# Patient Record
Sex: Female | Born: 1980
Health system: Southern US, Community
[De-identification: ages and names within clinical notes are randomized; demographics above are authoritative.]

## PROBLEM LIST (undated history)

## (undated) DIAGNOSIS — Z9889 Other specified postprocedural states: Secondary | ICD-10-CM

## (undated) DIAGNOSIS — F419 Anxiety disorder, unspecified: Secondary | ICD-10-CM

## (undated) DIAGNOSIS — R42 Dizziness and giddiness: Secondary | ICD-10-CM

## (undated) DIAGNOSIS — Z8619 Personal history of other infectious and parasitic diseases: Secondary | ICD-10-CM

## (undated) DIAGNOSIS — T8859XA Other complications of anesthesia, initial encounter: Secondary | ICD-10-CM

## (undated) DIAGNOSIS — C801 Malignant (primary) neoplasm, unspecified: Secondary | ICD-10-CM

## (undated) DIAGNOSIS — R51 Headache: Secondary | ICD-10-CM

## (undated) DIAGNOSIS — D649 Anemia, unspecified: Secondary | ICD-10-CM

## (undated) DIAGNOSIS — O139 Gestational [pregnancy-induced] hypertension without significant proteinuria, unspecified trimester: Secondary | ICD-10-CM

## (undated) DIAGNOSIS — K649 Unspecified hemorrhoids: Secondary | ICD-10-CM

## (undated) DIAGNOSIS — F429 Obsessive-compulsive disorder, unspecified: Secondary | ICD-10-CM

## (undated) DIAGNOSIS — R112 Nausea with vomiting, unspecified: Secondary | ICD-10-CM

## (undated) DIAGNOSIS — T4145XA Adverse effect of unspecified anesthetic, initial encounter: Secondary | ICD-10-CM

## (undated) HISTORY — PX: WISDOM TOOTH EXTRACTION: SHX21

## (undated) HISTORY — PX: DILATION AND CURETTAGE OF UTERUS: SHX78

## (undated) HISTORY — DX: Headache: R51

## (undated) HISTORY — DX: Gestational (pregnancy-induced) hypertension without significant proteinuria, unspecified trimester: O13.9

## (undated) HISTORY — DX: Obsessive-compulsive disorder, unspecified: F42.9

## (undated) HISTORY — DX: Dizziness and giddiness: R42

## (undated) HISTORY — DX: Anxiety disorder, unspecified: F41.9

## (undated) HISTORY — PX: OTHER SURGICAL HISTORY: SHX169

## (undated) HISTORY — DX: Personal history of other infectious and parasitic diseases: Z86.19

---
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
## 2002-04-20 ENCOUNTER — Encounter: Payer: Self-pay | Admitting: Internal Medicine

## 2002-04-20 ENCOUNTER — Encounter: Admission: RE | Admit: 2002-04-20 | Discharge: 2002-04-20 | Payer: Self-pay | Admitting: Internal Medicine

## 2003-05-08 HISTORY — PX: BREAST SURGERY: SHX581

## 2004-12-12 ENCOUNTER — Ambulatory Visit (HOSPITAL_COMMUNITY): Admission: RE | Admit: 2004-12-12 | Discharge: 2004-12-12 | Payer: Self-pay | Admitting: Chiropractic Medicine

## 2006-02-13 ENCOUNTER — Other Ambulatory Visit: Admission: RE | Admit: 2006-02-13 | Discharge: 2006-02-13 | Payer: Self-pay | Admitting: Obstetrics and Gynecology

## 2007-02-24 ENCOUNTER — Other Ambulatory Visit: Admission: RE | Admit: 2007-02-24 | Discharge: 2007-02-24 | Payer: Self-pay | Admitting: Obstetrics and Gynecology

## 2008-04-07 ENCOUNTER — Other Ambulatory Visit: Admission: RE | Admit: 2008-04-07 | Discharge: 2008-04-07 | Payer: Self-pay | Admitting: Obstetrics and Gynecology

## 2008-09-29 ENCOUNTER — Other Ambulatory Visit: Admission: RE | Admit: 2008-09-29 | Discharge: 2008-09-29 | Payer: Self-pay | Admitting: Obstetrics and Gynecology

## 2009-02-01 ENCOUNTER — Other Ambulatory Visit: Admission: RE | Admit: 2009-02-01 | Discharge: 2009-02-01 | Payer: Self-pay | Admitting: Obstetrics and Gynecology

## 2009-12-06 ENCOUNTER — Other Ambulatory Visit: Admission: RE | Admit: 2009-12-06 | Discharge: 2009-12-06 | Payer: Self-pay | Admitting: Obstetrics and Gynecology

## 2011-11-12 ENCOUNTER — Other Ambulatory Visit: Payer: Self-pay

## 2011-12-10 ENCOUNTER — Other Ambulatory Visit (HOSPITAL_COMMUNITY): Payer: Self-pay | Admitting: Obstetrics & Gynecology

## 2011-12-10 DIAGNOSIS — O3512X0 Maternal care for (suspected) chromosomal abnormality in fetus, trisomy 18, not applicable or unspecified: Secondary | ICD-10-CM

## 2011-12-10 DIAGNOSIS — O3510X0 Maternal care for (suspected) chromosomal abnormality in fetus, unspecified, not applicable or unspecified: Secondary | ICD-10-CM

## 2011-12-10 DIAGNOSIS — IMO0002 Reserved for concepts with insufficient information to code with codable children: Secondary | ICD-10-CM

## 2011-12-10 DIAGNOSIS — O351XX0 Maternal care for (suspected) chromosomal abnormality in fetus, not applicable or unspecified: Secondary | ICD-10-CM

## 2011-12-12 ENCOUNTER — Ambulatory Visit (HOSPITAL_COMMUNITY)
Admission: RE | Admit: 2011-12-12 | Discharge: 2011-12-12 | Disposition: A | Discharge status: Home / Self Care | Payer: BC Managed Care – PPO | Source: Ambulatory Visit | Attending: Obstetrics & Gynecology | Admitting: Obstetrics & Gynecology

## 2011-12-12 ENCOUNTER — Encounter (HOSPITAL_COMMUNITY): Payer: Self-pay

## 2011-12-12 ENCOUNTER — Other Ambulatory Visit (HOSPITAL_COMMUNITY): Payer: Self-pay | Admitting: Obstetrics & Gynecology

## 2011-12-12 ENCOUNTER — Ambulatory Visit (HOSPITAL_COMMUNITY): Admission: RE | Admit: 2011-12-12 | Payer: BC Managed Care – PPO | Source: Ambulatory Visit

## 2011-12-12 ENCOUNTER — Other Ambulatory Visit (HOSPITAL_COMMUNITY): Payer: Self-pay

## 2011-12-12 DIAGNOSIS — IMO0001 Reserved for inherently not codable concepts without codable children: Secondary | ICD-10-CM | POA: Insufficient documentation

## 2011-12-12 DIAGNOSIS — IMO0002 Reserved for concepts with insufficient information to code with codable children: Secondary | ICD-10-CM

## 2011-12-12 DIAGNOSIS — O3510X0 Maternal care for (suspected) chromosomal abnormality in fetus, unspecified, not applicable or unspecified: Secondary | ICD-10-CM

## 2011-12-12 DIAGNOSIS — O351XX0 Maternal care for (suspected) chromosomal abnormality in fetus, not applicable or unspecified: Secondary | ICD-10-CM

## 2011-12-12 DIAGNOSIS — O289 Unspecified abnormal findings on antenatal screening of mother: Secondary | ICD-10-CM | POA: Insufficient documentation

## 2011-12-12 DIAGNOSIS — O3512X0 Maternal care for (suspected) chromosomal abnormality in fetus, trisomy 18, not applicable or unspecified: Secondary | ICD-10-CM

## 2011-12-12 NOTE — Progress Notes (Signed)
Genetic Counseling  High-Risk Gestation Note  Appointment Date:  12/12/2011 Referred By: Robley Fries, MD Date of Birth:  [birth year] Partner:  Maria Berg   Pregnancy History: G1P0 Estimated Date of Delivery: 06/13/12 Estimated Gestational Age: [redacted]w[redacted]d Attending: Alpha Gula, MD  Maria Berg and her husband, Maria Berg, were seen for genetic counseling because of an increased risk for fetal trisomy 18/13 based on the biochemistry portion of First trimester screening.  A nuchal translucency was not obtained and thus not incorporated into the risk calculation.  They were counseled regarding the First trimester biochemistry result and the associated 1 in <5 risk for fetal trisomy 18/13.  We reviewed chromosomes, nondisjunction, and the common features and poor prognosis of trisomy 18/13.  In addition, we reviewed the screen adjusted reduction in risks for Down syndrome (1 in 609 to 1 in 4344).  We also discussed other explanations for a screen positive result including: a gestational dating error, differences in maternal metabolism, and normal variation. They understand that this screening is not diagnostic for aneuploidy but provides a pregnancy specific risk assessment for Down syndrome, trisomy 18/13.  We reviewed other available screening options including noninvasive prenatal testing (NIPT) and detailed ultrasound.  Specifically, we discussed that NIPT analyzes cell free fetal DNA found in the maternal circulation. This test is not diagnostic for chromosome conditions, but can provide information regarding the presence or absence of extra fetal DNA for chromosomes 13, 18, 21, X, and Y, and missing fetal DNA for chromosome X and Y (Turner syndrome). Thus, it would not identify or rule out all genetic conditions. The reported detection rate is greater than 99% for Trisomy 21, greater than 98% for Trisomy 18, and is approximately 80% (8 out of 10) for Trisomy 13. The false positive  rate is reported to be less than 0.1% for any of these conditions.  Maria Berg reported that she had this testing performed in her obstetrician's office.  The results are pending.    In addition, we discussed that ~90-95% of fetuses with trisomy 18/13, when well visualized, have detectable anomalies or soft markers by detailed ultrasound (~18+ weeks gestation).  Ultrasound was performed today and revealed size less than dates and a single umbilical artery.  The fetal anatomy was not well visualized given the early gestational age.  We discussed that the finding of early growth restriction and SUA are concerning for trisomy 18.  Given the combination of findings, this couple was counseled that the risk for aneuploidy is increased above the screen adjusted 1 in <5.  This considered, we reviewed the availability of diagnostic testing via CVS and amniocentesis.  We reviewed the risks, benefits, and limitations of each, including the risk(s) for spontaneous pregnancy loss. After consideration of all the options, they elected to attempt [facility]S.  The patient was sent to [facility] in [place].  Due to the position of the placenta, the procedure was not performed.  The patient wishes to pursue diagnostic testing as soon as possible.  An amniocentesis has been tentatively scheduled with Dr. Otho Perl on December 21, 2011.  Management recommendations will be made pending discussion of the FISH results.  We reviewed the options of continuing and termination of pregnancy.  This couple wish to defer decision making until they have definitive results.  They were counseled regarding the 20 week limit for termination in [place].  Given the nature of today's visit, this couple declined a formal review of the family history  until a later date.  We will review this history in detail at a future appointment.  Without further information regarding the provided family history, an accurate genetic risk cannot be  calculated. Further genetic counseling is warranted if more information is obtained.  Maria Berg denied exposure to environmental toxins or chemical agents. She denied the use of alcohol, tobacco or street drugs. She denied significant viral illnesses during the course of her pregnancy.   I counseled this couple for approximately 80 minutes regarding the above risks and available options.     Donald Prose, MS  Certified Genetic Counselor

## 2011-12-12 NOTE — Progress Notes (Signed)
Patient seen today  for ultrasound.  See full report in [facility].  Maria Therien, MD 

## 2011-12-25 ENCOUNTER — Other Ambulatory Visit (HOSPITAL_COMMUNITY): Payer: Self-pay

## 2011-12-25 ENCOUNTER — Encounter (HOSPITAL_COMMUNITY): Payer: Self-pay

## 2012-02-26 ENCOUNTER — Other Ambulatory Visit (HOSPITAL_COMMUNITY): Payer: Self-pay | Admitting: Obstetrics & Gynecology

## 2012-02-26 DIAGNOSIS — Z3682 Encounter for antenatal screening for nuchal translucency: Secondary | ICD-10-CM

## 2012-03-11 ENCOUNTER — Encounter: Payer: Self-pay | Admitting: Obstetrics & Gynecology

## 2012-03-11 LAB — US OB TRANSVAGINAL

## 2012-03-19 ENCOUNTER — Other Ambulatory Visit: Payer: Self-pay

## 2012-03-19 ENCOUNTER — Encounter (HOSPITAL_COMMUNITY): Payer: Self-pay

## 2012-03-19 ENCOUNTER — Ambulatory Visit (HOSPITAL_COMMUNITY)
Admission: RE | Admit: 2012-03-19 | Discharge: 2012-03-19 | Disposition: A | Discharge status: Home / Self Care | Payer: BC Managed Care – PPO | Source: Ambulatory Visit | Attending: Obstetrics & Gynecology | Admitting: Obstetrics & Gynecology

## 2012-03-19 DIAGNOSIS — IMO0001 Reserved for inherently not codable concepts without codable children: Secondary | ICD-10-CM | POA: Insufficient documentation

## 2012-03-19 DIAGNOSIS — O289 Unspecified abnormal findings on antenatal screening of mother: Secondary | ICD-10-CM | POA: Insufficient documentation

## 2012-03-28 ENCOUNTER — Telehealth (HOSPITAL_COMMUNITY): Payer: Self-pay

## 2012-03-28 NOTE — Telephone Encounter (Signed)
Called Maria Berg to discuss her [facility], cell free fetal DNA testing. Patient was identified by name and date of birth.  Testing was offered because of a prior pregnancy with aneuploidy.  We reviewed that these are within normal limits, showing a less than 1 in 10,000 risk for trisomies 21, 18 and 13. We reviewed that this testing identifies > 99% of pregnancies with trisomy 21, >98% of pregnancies with trisomy 24, and >80% with trisomy 57; the false positive rate is <0.1% for all conditions. Testing was also performed for X and Y chromosome analysis. This did not show evidence of aneuploidy for X or Y. It was also consistent with female gender. X and Y analysis has a detection rate of approximately 99%. She understands that this testing does not identify all genetic conditions. All questions were answered to her satisfaction, she was encouraged to call with additional questions or concerns.  Her NT ultrasound is scheduled for next week, 04/02/12.  Patient understands that I will not be in the office that day but was encouraged to contact me with any questions.  Despina Arias, MS Certified Genetic Counselor

## 2012-04-02 ENCOUNTER — Other Ambulatory Visit (HOSPITAL_COMMUNITY): Payer: BC Managed Care – PPO

## 2012-04-02 ENCOUNTER — Ambulatory Visit (HOSPITAL_COMMUNITY)
Admission: RE | Admit: 2012-04-02 | Discharge: 2012-04-02 | Disposition: A | Discharge status: Home / Self Care | Payer: BC Managed Care – PPO | Source: Ambulatory Visit | Attending: Obstetrics & Gynecology | Admitting: Obstetrics & Gynecology

## 2012-04-02 ENCOUNTER — Encounter (HOSPITAL_COMMUNITY): Payer: Self-pay

## 2012-04-02 VITALS — BP 140/90 | HR 106 | Wt 151.0 lb

## 2012-04-02 DIAGNOSIS — Z3682 Encounter for antenatal screening for nuchal translucency: Secondary | ICD-10-CM

## 2012-04-02 DIAGNOSIS — O351XX0 Maternal care for (suspected) chromosomal abnormality in fetus, not applicable or unspecified: Secondary | ICD-10-CM | POA: Insufficient documentation

## 2012-04-02 DIAGNOSIS — O3510X0 Maternal care for (suspected) chromosomal abnormality in fetus, unspecified, not applicable or unspecified: Secondary | ICD-10-CM | POA: Insufficient documentation

## 2012-04-02 DIAGNOSIS — Z3689 Encounter for other specified antenatal screening: Secondary | ICD-10-CM | POA: Insufficient documentation

## 2012-04-02 DIAGNOSIS — Z1389 Encounter for screening for other disorder: Secondary | ICD-10-CM

## 2012-04-02 DIAGNOSIS — O352XX0 Maternal care for (suspected) hereditary disease in fetus, not applicable or unspecified: Secondary | ICD-10-CM | POA: Insufficient documentation

## 2012-04-02 NOTE — Progress Notes (Signed)
Maria Berg  was seen today for an ultrasound appointment.  See full report in [facility].  Comments: Ms. Lakins was seen today for NT evaluation.  Her recent pregnancy was affected by Triploidy.  Recent NIPT showed low risk for aneuploidy - the patient is aware of the limitations of cell free fetal DNA in making the diagnosis of Triploidy.  Impression: Single IUP at 12 2/7 weeks. Normal NT for current gestational age (1.5 mm) Nasal bone visualized  Recommendations: Recommend follow up ultrasound in 6 weeks for detailed anatomy.   Alpha Gula, MD

## 2012-04-04 ENCOUNTER — Other Ambulatory Visit: Payer: Self-pay | Admitting: Obstetrics & Gynecology

## 2012-04-08 LAB — OB RESULTS CONSOLE ABO/RH: RH Type: POSITIVE

## 2012-04-08 LAB — OB RESULTS CONSOLE HEPATITIS B SURFACE ANTIGEN: Hepatitis B Surface Ag: NEGATIVE

## 2012-04-08 LAB — OB RESULTS CONSOLE ANTIBODY SCREEN: Antibody Screen: NEGATIVE

## 2012-04-09 ENCOUNTER — Ambulatory Visit (HOSPITAL_COMMUNITY): Payer: BC Managed Care – PPO

## 2012-04-17 LAB — OB RESULTS CONSOLE GC/CHLAMYDIA: Chlamydia: NEGATIVE

## 2012-05-07 NOTE — L&D Delivery Note (Signed)
Operative Delivery Note At 10:38 PM a viable and healthy female was delivered via Vaginal, Spontaneous Delivery.  Presentation: vertex; Position: Occiput,, Anterior; Station: +2.  Verbal consent: obtained from patient.  Risks and benefits discussed in detail.  Risks include, but are not limited to the risks of anesthesia, bleeding, infection, damage to maternal tissues, fetal cephalhematoma.  There is also the risk of inability to effect vaginal delivery of the head, or shoulder dystocia that cannot be resolved by established maneuvers, leading to the need for emergency cesarean section.  APGAR: 9, 9; weight 6 lb 13 oz (3090 g).   Placenta status: Intact, Spontaneous, marginal cord insertion Pathology.  Placental abruption Cord:  CAN x 1 reducible vessels with the following complications: None.  Cord pH: pending  Anesthesia: Epidural  Instruments: mushroom vacuum Episiotomy: None Lacerations: 2nd degree;Perineal Suture Repair: 30 chromic Est. Blood Loss (mL): 350  Mom to postpartum.  Baby to nursery-stable.  Cheryllynn Sarff A 10/23/2012, 11:18 PM

## 2012-05-13 ENCOUNTER — Ambulatory Visit (HOSPITAL_COMMUNITY)
Admission: RE | Admit: 2012-05-13 | Discharge: 2012-05-13 | Disposition: A | Discharge status: Home / Self Care | Payer: BC Managed Care – PPO | Source: Ambulatory Visit | Attending: Obstetrics & Gynecology | Admitting: Obstetrics & Gynecology

## 2012-05-13 DIAGNOSIS — Z1389 Encounter for screening for other disorder: Secondary | ICD-10-CM

## 2012-05-13 DIAGNOSIS — O352XX0 Maternal care for (suspected) hereditary disease in fetus, not applicable or unspecified: Secondary | ICD-10-CM | POA: Insufficient documentation

## 2012-05-13 DIAGNOSIS — Z363 Encounter for antenatal screening for malformations: Secondary | ICD-10-CM | POA: Insufficient documentation

## 2012-05-13 DIAGNOSIS — O358XX0 Maternal care for other (suspected) fetal abnormality and damage, not applicable or unspecified: Secondary | ICD-10-CM | POA: Insufficient documentation

## 2012-05-13 NOTE — Progress Notes (Signed)
Maria Berg was seen for ultrasound appointment today.  Please see [facility] report for details.

## 2012-10-13 ENCOUNTER — Telehealth (HOSPITAL_COMMUNITY): Payer: Self-pay | Admitting: *Deleted

## 2012-10-13 ENCOUNTER — Encounter (HOSPITAL_COMMUNITY): Payer: Self-pay | Admitting: *Deleted

## 2012-10-13 NOTE — Telephone Encounter (Signed)
Preadmission screen  

## 2012-10-22 ENCOUNTER — Telehealth (HOSPITAL_COMMUNITY): Payer: Self-pay | Admitting: *Deleted

## 2012-10-22 ENCOUNTER — Encounter (HOSPITAL_COMMUNITY): Payer: Self-pay | Admitting: *Deleted

## 2012-10-22 NOTE — Telephone Encounter (Signed)
Preadmission screen  

## 2012-10-23 ENCOUNTER — Other Ambulatory Visit: Payer: Self-pay | Admitting: Obstetrics & Gynecology

## 2012-10-23 ENCOUNTER — Encounter (HOSPITAL_COMMUNITY): Payer: Self-pay | Admitting: *Deleted

## 2012-10-23 ENCOUNTER — Inpatient Hospital Stay (HOSPITAL_COMMUNITY)
Admission: AD | Admit: 2012-10-23 | Discharge: 2012-10-25 | DRG: 372 | Disposition: A | Discharge status: Home / Self Care | Payer: BC Managed Care – PPO | Source: Ambulatory Visit | Attending: Obstetrics and Gynecology | Admitting: Obstetrics and Gynecology

## 2012-10-23 ENCOUNTER — Inpatient Hospital Stay (HOSPITAL_COMMUNITY): Payer: BC Managed Care – PPO | Admitting: Anesthesiology

## 2012-10-23 ENCOUNTER — Encounter (HOSPITAL_COMMUNITY): Payer: Self-pay | Admitting: Anesthesiology

## 2012-10-23 DIAGNOSIS — Z22330 Carrier of Group B streptococcus: Secondary | ICD-10-CM

## 2012-10-23 DIAGNOSIS — O480 Post-term pregnancy: Secondary | ICD-10-CM | POA: Diagnosis present

## 2012-10-23 DIAGNOSIS — O4590 Premature separation of placenta, unspecified, unspecified trimester: Secondary | ICD-10-CM | POA: Diagnosis present

## 2012-10-23 DIAGNOSIS — O878 Other venous complications in the puerperium: Secondary | ICD-10-CM | POA: Diagnosis present

## 2012-10-23 DIAGNOSIS — K649 Unspecified hemorrhoids: Secondary | ICD-10-CM | POA: Diagnosis present

## 2012-10-23 DIAGNOSIS — O99892 Other specified diseases and conditions complicating childbirth: Secondary | ICD-10-CM | POA: Diagnosis present

## 2012-10-23 LAB — DIC (DISSEMINATED INTRAVASCULAR COAGULATION)PANEL
D-Dimer, Quant: 2.43 ug/mL-FEU — ABNORMAL HIGH (ref 0.00–0.48)
Fibrinogen: 599 mg/dL — ABNORMAL HIGH (ref 204–475)
INR: 0.95 (ref 0.00–1.49)
Prothrombin Time: 12.6 seconds (ref 11.6–15.2)
aPTT: 29 seconds (ref 24–37)

## 2012-10-23 LAB — TYPE AND SCREEN: ABO/RH(D): A POS

## 2012-10-23 LAB — CBC
Hemoglobin: 13.5 g/dL (ref 12.0–15.0)
MCHC: 35.2 g/dL (ref 30.0–36.0)

## 2012-10-23 LAB — ABO/RH: ABO/RH(D): A POS

## 2012-10-23 LAB — RPR: RPR Ser Ql: NONREACTIVE

## 2012-10-23 MED ORDER — FAMOTIDINE IN NACL 20-0.9 MG/50ML-% IV SOLN
20.0000 mg | Freq: Two times a day (BID) | INTRAVENOUS | Status: DC
  Administered 2012-10-23: 20 mg via INTRAVENOUS
  Filled 2012-10-23 (×2): qty 50

## 2012-10-23 MED ORDER — OXYTOCIN 40 UNITS IN LACTATED RINGERS INFUSION - SIMPLE MED: 62.5000 mL/h | INTRAVENOUS | Status: DC

## 2012-10-23 MED ORDER — PENICILLIN G POTASSIUM 5000000 UNITS IJ SOLR
5.0000 10*6.[IU] | Freq: Once | INTRAVENOUS | Status: AC
  Administered 2012-10-23: 5 10*6.[IU] via INTRAVENOUS
  Filled 2012-10-23: qty 5

## 2012-10-23 MED ORDER — ONDANSETRON HCL 4 MG/2ML IJ SOLN: 4.0000 mg | Freq: Four times a day (QID) | INTRAMUSCULAR | Status: DC | PRN

## 2012-10-23 MED ORDER — DIPHENHYDRAMINE HCL 50 MG/ML IJ SOLN: 12.5000 mg | INTRAMUSCULAR | Status: DC | PRN

## 2012-10-23 MED ORDER — LIDOCAINE HCL (PF) 1 % IJ SOLN
INTRAMUSCULAR | Status: DC | PRN
  Administered 2012-10-23 (×2): 4 mL

## 2012-10-23 MED ORDER — PHENYLEPHRINE 40 MCG/ML (10ML) SYRINGE FOR IV PUSH (FOR BLOOD PRESSURE SUPPORT)
80.0000 ug | PREFILLED_SYRINGE | INTRAVENOUS | Status: DC | PRN
  Filled 2012-10-23: qty 2
  Filled 2012-10-23: qty 5

## 2012-10-23 MED ORDER — OXYTOCIN BOLUS FROM INFUSION: 500.0000 mL | INTRAVENOUS | Status: DC

## 2012-10-23 MED ORDER — IBUPROFEN 600 MG PO TABS
600.0000 mg | ORAL_TABLET | Freq: Four times a day (QID) | ORAL | Status: DC | PRN
  Administered 2012-10-23: 600 mg via ORAL
  Filled 2012-10-23: qty 1

## 2012-10-23 MED ORDER — OXYTOCIN 40 UNITS IN LACTATED RINGERS INFUSION - SIMPLE MED
1.0000 m[IU]/min | INTRAVENOUS | Status: DC
  Administered 2012-10-23: 2 m[IU]/min via INTRAVENOUS
  Filled 2012-10-23: qty 1000

## 2012-10-23 MED ORDER — EPHEDRINE 5 MG/ML INJ
10.0000 mg | INTRAVENOUS | Status: DC | PRN
  Filled 2012-10-23: qty 2

## 2012-10-23 MED ORDER — LACTATED RINGERS IV SOLN
INTRAVENOUS | Status: DC
  Administered 2012-10-23: 1000 mL via INTRAVENOUS

## 2012-10-23 MED ORDER — CITRIC ACID-SODIUM CITRATE 334-500 MG/5ML PO SOLN: 30.0000 mL | ORAL | Status: DC | PRN

## 2012-10-23 MED ORDER — LACTATED RINGERS IV SOLN: 500.0000 mL | Freq: Once | INTRAVENOUS | Status: DC

## 2012-10-23 MED ORDER — OXYCODONE-ACETAMINOPHEN 5-325 MG PO TABS: 1.0000 | ORAL_TABLET | ORAL | Status: DC | PRN

## 2012-10-23 MED ORDER — BUPIVACAINE HCL (PF) 0.25 % IJ SOLN
INTRAMUSCULAR | Status: AC
  Filled 2012-10-23: qty 30

## 2012-10-23 MED ORDER — ACETAMINOPHEN 325 MG PO TABS: 650.0000 mg | ORAL_TABLET | ORAL | Status: DC | PRN

## 2012-10-23 MED ORDER — LIDOCAINE HCL (PF) 1 % IJ SOLN
30.0000 mL | INTRAMUSCULAR | Status: DC | PRN
  Administered 2012-10-23: 30 mL via SUBCUTANEOUS
  Filled 2012-10-23 (×2): qty 30

## 2012-10-23 MED ORDER — FENTANYL 2.5 MCG/ML BUPIVACAINE 1/10 % EPIDURAL INFUSION (WH - ANES)
14.0000 mL/h | INTRAMUSCULAR | Status: DC | PRN
  Filled 2012-10-23: qty 125

## 2012-10-23 MED ORDER — PENICILLIN G POTASSIUM 5000000 UNITS IJ SOLR
2.5000 10*6.[IU] | INTRAVENOUS | Status: DC
  Filled 2012-10-23 (×5): qty 2.5

## 2012-10-23 MED ORDER — PHENYLEPHRINE 40 MCG/ML (10ML) SYRINGE FOR IV PUSH (FOR BLOOD PRESSURE SUPPORT)
80.0000 ug | PREFILLED_SYRINGE | INTRAVENOUS | Status: DC | PRN
  Filled 2012-10-23: qty 2

## 2012-10-23 MED ORDER — LACTATED RINGERS IV SOLN: 500.0000 mL | INTRAVENOUS | Status: DC | PRN

## 2012-10-23 MED ORDER — FENTANYL 2.5 MCG/ML BUPIVACAINE 1/10 % EPIDURAL INFUSION (WH - ANES)
INTRAMUSCULAR | Status: DC | PRN
  Administered 2012-10-23: 14 mL/h via EPIDURAL

## 2012-10-23 MED ORDER — EPHEDRINE 5 MG/ML INJ
10.0000 mg | INTRAVENOUS | Status: DC | PRN
  Filled 2012-10-23: qty 4
  Filled 2012-10-23: qty 2

## 2012-10-23 NOTE — H&P (Signed)
Maria Berg is a 32 y.o. female presenting for admission  @ 41 3/[redacted] weeks gestation due to labor. Intact membrane. GBS cx (+) . Maternal Medical History:  Reason for admission: Contractions.   Contractions: Frequency: irregular.   Perceived severity is strong.    Fetal activity: Perceived fetal activity is normal.    Prenatal complications: no prenatal complications Prenatal Complications - Diabetes: none.    OB History   Grav Para Term Preterm Abortions TAB SAB Ect Mult Living   2 0 0 0 1 1 0 0 0 0      Past Medical History  Diagnosis Date  . Headache(784.0)   . Pregnancy induced hypertension   . Hx of varicella    Past Surgical History  Procedure Laterality Date  . Wisdom tooth extraction    . Breast surgery  2005    aug  . Dilation and curettage of uterus      16 wk. w/triploidy   Family History: family history includes Alcohol abuse in her paternal grandfather, paternal grandmother, and paternal uncle; Arthritis in her maternal grandmother and mother; Asthma in her father and paternal grandfather; Cancer in her father and paternal grandmother; Depression in her father, paternal grandmother, and sister; Diabetes in her maternal grandfather and paternal grandfather; Drug abuse in her paternal uncle; Learning disabilities in her maternal uncle; and Mental illness in her paternal grandmother.  There is no history of Hearing loss, and Early death, and Heart disease, and Hyperlipidemia, and Hypertension, and Kidney disease, and Vision loss, and Stroke, and Miscarriages / [place], and Mental retardation, . Social History:  reports that she has never smoked. She has never used smokeless tobacco. She reports that she does not drink alcohol or use illicit drugs.   Prenatal Transfer Tool  Maternal Diabetes: No Genetic Screening: Normal Maternal Ultrasounds/Referrals: Normal Fetal Ultrasounds or other Referrals:  None Maternal Substance Abuse:  No Significant Maternal  Medications:  None Significant Maternal Lab Results:  Lab values include: Group B Strep positive Other Comments:  None  ROS neg  Dilation: 4 Effacement (%): 80 Station: -2 Exam by:: Dr Cherly Hensen Blood pressure 148/88, pulse 107, temperature 99.3 F (37.4 C), temperature source Oral, resp. rate 20, height 5\' 6"  (1.676 m), weight 78.926 kg (174 lb), SpO2 86.00%. Maternal Exam:  Uterine Assessment: Contraction strength is mild.  Contraction frequency is irregular.   Abdomen: Patient reports no abdominal tenderness. Introitus: Normal vulva. Pelvis: adequate for delivery.   Cervix: Cervix evaluated by digital exam.     Physical Exam  Constitutional: She appears well-developed and well-nourished.  HENT:  Head: Atraumatic.  Eyes: EOM are normal.  Neck: Neck supple.  Cardiovascular: Normal rate and regular rhythm.   Respiratory: Breath sounds normal.  GI: Soft.  Musculoskeletal: She exhibits no edema.  Skin: Skin is warm and dry.  Psychiatric: She has a normal mood and affect.   VE: dark blood clots  Prenatal labs: ABO, Rh: A/Positive/-- (12/03 0000) Antibody: Negative (12/03 0000) Rubella: Immune (12/03 0000) RPR: Nonreactive (12/03 0000)  HBsAg: Negative (12/03 0000)  HIV: Non-reactive (12/03 0000)  GBS: Positive (05/06 0000)   Assessment/Plan: Postdates GBS cx(+) 3rd trimester vaginal bleeding ? Marginal placental abruption P) admit IV PCN. PIH labs, DIC panel. Pitocin augmentation prn. Amniotomy prn epidural   Maria Berg A 10/23/2012, 7:09 PM

## 2012-10-23 NOTE — Anesthesia Procedure Notes (Signed)
Epidural Patient location during procedure: OB Start time: 10/23/2012 6:38 PM  Staffing Anesthesiologist: Lois Ostrom A. Performed by: anesthesiologist   Preanesthetic Checklist Completed: patient identified, site marked, surgical consent, pre-op evaluation, timeout performed, IV checked, risks and benefits discussed and monitors and equipment checked  Epidural Patient position: sitting Prep: site prepped and draped and DuraPrep Patient monitoring: continuous pulse ox and blood pressure Approach: midline Injection technique: LOR air  Needle:  Needle type: Tuohy  Needle gauge: 17 G Needle length: 9 cm and 9 Needle insertion depth: 4 cm Catheter type: closed end flexible Catheter size: 19 Gauge Catheter at skin depth: 9 cm Test dose: negative and Other  Assessment Events: blood not aspirated, injection not painful, no injection resistance, negative IV test and no paresthesia  Additional Notes Patient identified. Risks and benefits discussed including failed block, incomplete  Pain control, post dural puncture headache, nerve damage, paralysis, blood pressure Changes, nausea, vomiting, reactions to medications-both toxic and allergic and post Partum back pain. All questions were answered. Patient expressed understanding and wished to proceed. Sterile technique was used throughout procedure. Epidural site was Dressed with sterile barrier dressing. No paresthesias, signs of intravascular injection Or signs of intrathecal spread were encountered.  Patient was more comfortable after the epidural was dosed. Please see RN's note for documentation of vital signs and FHR which are stable.

## 2012-10-23 NOTE — Anesthesia Preprocedure Evaluation (Signed)

## 2012-10-23 NOTE — Progress Notes (Signed)
Vacuum applied, NICU team called for delivery.

## 2012-10-23 NOTE — Progress Notes (Signed)
Maria Berg is a 32 y.o. G2P0010 at [redacted]w[redacted]d by ultrasound admitted for active labor  Subjective: No chief complaint on file.  Epidural  Objective: BP 115/75  Pulse 102  Temp(Src) 99.3 F (37.4 C) (Oral)  Resp 18  Ht 5\' 6"  (1.676 m)  Wt 78.926 kg (174 lb)  BMI 28.1 kg/m2  SpO2 100%      FHT:  FHR: 140 bpm, variability: moderate,  accelerations:  Present,  decelerations:  Absent UC:   irregular, every 4 minutes SVE:   4/80/-2 AROM bloody fluid. IUPC right OA  asynclytic Labs: Lab Results  Component Value Date   WBC 21.5* 10/23/2012   HGB 13.5 10/23/2012   HCT 38.4 10/23/2012   MCV 90.8 10/23/2012   PLT 234 10/23/2012   BMET No results found for this basename: na, k, cl, co2, glucose, bun, creatinine, calcium, gfrnonaa, gfraa   DIC panel: elev D-dimer, fibrinogen 599 plt 234K  Assessment / Plan: Arrest in active phase of labor Marginal abruption Postdates GBS cx ) on PCN P) exaggerated right sims.  Pitocin augmentation if AROM does not cause change in one hour   Anticipated MOD:  NSVD  Pattricia Weiher A 10/23/2012, 8:16 PM

## 2012-10-23 NOTE — Progress Notes (Signed)
S: notes some rectal pressure  O: epidural Pitocin 2 MIU  Tracing reviewed: baseline 140-145 (+) late decels noted. (+) variability VE: fully (+) 1 station (+) blood @ 10: 22 p ISE placed Mat O2  Decision made to start push. Pushed x 1 ctx led to fetal heart rate deceleration down to 80's not responsive to positional changes. Returned to baseline after 1-7mins Pt and husband advised of need for vacuum delivery. Pedi called. Procedure and reisk explained to them  IMP: marginal placental abruption w/ fetal intolerance to pushing P) vacuum assistance w/ delivery once pedi present. Foley and IUPC removed.

## 2012-10-24 ENCOUNTER — Encounter (HOSPITAL_COMMUNITY): Payer: Self-pay | Admitting: *Deleted

## 2012-10-24 LAB — CBC
Hemoglobin: 10.7 g/dL — ABNORMAL LOW (ref 12.0–15.0)
MCH: 31.2 pg (ref 26.0–34.0)
Platelets: 220 10*3/uL (ref 150–400)
RBC: 3.43 MIL/uL — ABNORMAL LOW (ref 3.87–5.11)
WBC: 17.5 10*3/uL — ABNORMAL HIGH (ref 4.0–10.5)

## 2012-10-24 MED ORDER — HYDROCORTISONE 1 % EX CREA
TOPICAL_CREAM | Freq: Two times a day (BID) | CUTANEOUS | Status: DC
  Administered 2012-10-24: 22:00:00 via TOPICAL
  Filled 2012-10-24: qty 28

## 2012-10-24 MED ORDER — ONDANSETRON HCL 4 MG/2ML IJ SOLN: 4.0000 mg | INTRAMUSCULAR | Status: DC | PRN

## 2012-10-24 MED ORDER — SIMETHICONE 80 MG PO CHEW: 80.0000 mg | CHEWABLE_TABLET | ORAL | Status: DC | PRN

## 2012-10-24 MED ORDER — WITCH HAZEL-GLYCERIN EX PADS: 1.0000 "application " | MEDICATED_PAD | CUTANEOUS | Status: DC | PRN

## 2012-10-24 MED ORDER — IBUPROFEN 600 MG PO TABS
600.0000 mg | ORAL_TABLET | Freq: Four times a day (QID) | ORAL | Status: DC
  Administered 2012-10-24 – 2012-10-25 (×6): 600 mg via ORAL
  Filled 2012-10-24 (×7): qty 1

## 2012-10-24 MED ORDER — ZOLPIDEM TARTRATE 5 MG PO TABS: 5.0000 mg | ORAL_TABLET | Freq: Every evening | ORAL | Status: DC | PRN

## 2012-10-24 MED ORDER — HYDROCORTISONE ACE-PRAMOXINE 1-1 % RE FOAM
1.0000 | Freq: Two times a day (BID) | RECTAL | Status: DC
  Administered 2012-10-24: 1 via RECTAL
  Filled 2012-10-24: qty 10

## 2012-10-24 MED ORDER — BENZOCAINE-MENTHOL 20-0.5 % EX AERO
1.0000 "application " | INHALATION_SPRAY | CUTANEOUS | Status: DC | PRN
  Administered 2012-10-25: 1 via TOPICAL
  Filled 2012-10-24 (×2): qty 56

## 2012-10-24 MED ORDER — FERROUS SULFATE 325 (65 FE) MG PO TABS
325.0000 mg | ORAL_TABLET | Freq: Two times a day (BID) | ORAL | Status: DC
  Administered 2012-10-24 – 2012-10-25 (×2): 325 mg via ORAL
  Filled 2012-10-24 (×2): qty 1

## 2012-10-24 MED ORDER — DOCUSATE SODIUM 100 MG PO CAPS
100.0000 mg | ORAL_CAPSULE | Freq: Every day | ORAL | Status: DC
  Administered 2012-10-24 – 2012-10-25 (×2): 100 mg via ORAL
  Filled 2012-10-24 (×2): qty 1

## 2012-10-24 MED ORDER — TETANUS-DIPHTH-ACELL PERTUSSIS 5-2.5-18.5 LF-MCG/0.5 IM SUSP: 0.5000 mL | Freq: Once | INTRAMUSCULAR | Status: DC

## 2012-10-24 MED ORDER — OXYCODONE-ACETAMINOPHEN 5-325 MG PO TABS
1.0000 | ORAL_TABLET | ORAL | Status: DC | PRN
  Administered 2012-10-24: 1 via ORAL
  Filled 2012-10-24: qty 1

## 2012-10-24 MED ORDER — DIPHENHYDRAMINE HCL 25 MG PO CAPS: 25.0000 mg | ORAL_CAPSULE | Freq: Four times a day (QID) | ORAL | Status: DC | PRN

## 2012-10-24 MED ORDER — DEXTROSE IN LACTATED RINGERS 5 % IV SOLN: INTRAVENOUS | Status: DC

## 2012-10-24 MED ORDER — SENNOSIDES-DOCUSATE SODIUM 8.6-50 MG PO TABS
2.0000 | ORAL_TABLET | Freq: Every day | ORAL | Status: DC
  Administered 2012-10-24: 2 via ORAL

## 2012-10-24 MED ORDER — PRENATAL MULTIVITAMIN CH
1.0000 | ORAL_TABLET | Freq: Every day | ORAL | Status: DC
  Administered 2012-10-24: 1 via ORAL
  Filled 2012-10-24: qty 1

## 2012-10-24 MED ORDER — DIBUCAINE 1 % RE OINT
1.0000 "application " | TOPICAL_OINTMENT | RECTAL | Status: DC | PRN
  Filled 2012-10-24: qty 28

## 2012-10-24 MED ORDER — LANOLIN HYDROUS EX OINT: TOPICAL_OINTMENT | CUTANEOUS | Status: DC | PRN

## 2012-10-24 MED ORDER — ONDANSETRON HCL 4 MG PO TABS: 4.0000 mg | ORAL_TABLET | ORAL | Status: DC | PRN

## 2012-10-24 NOTE — Anesthesia Postprocedure Evaluation (Signed)
Anesthesia Post Note  Patient: Maria Berg  Procedure(s) Performed: * No procedures listed *  Anesthesia type: Epidural  Patient location: Mother/Baby  Post pain: Pain level controlled  Post assessment: Post-op Vital signs reviewed  Last Vitals:  Filed Vitals:   10/24/12 0537  BP: 128/88  Pulse: 90  Temp: 36.4 C  Resp: 18    Post vital signs: Reviewed  Level of consciousness: awake  Complications: No apparent anesthesia complications

## 2012-10-24 NOTE — Progress Notes (Signed)
Patient did not void.

## 2012-10-24 NOTE — Progress Notes (Signed)
Patient ID: Maria Berg, female   DOB: 08/10/1980, 32 y.o.   MRN: [id number] PPD # 1  Subjective: Pt reports feeling "ok"; tired.  Pain mostly from hemorrhoids/ Pain controlled with ibuprofen; has not taken percocet Tolerating po/ Voiding without problems/ No n/v Bleeding is moderate Newborn info:  Information for the patient's newborn:  Maria Berg [[id number]]  female Feeding: breast   Objective:  VS: Blood pressure 115/77, pulse 101, temperature 98.4 F (36.9 C), temperature source Oral, resp. rate 16.    Recent Labs  10/23/12 1725 10/23/12 1850 10/24/12 0606  WBC 21.5*  --  17.5*  HGB 13.5  --  10.7*  HCT 38.4  --  31.3*  PLT 244 234 220    Blood type:  A POS (06/19 1725) Rubella: Immune (12/03 0000)    Physical Exam:  General:  alert, cooperative and no distress CV: Regular rate and rhythm Resp: clear Abdomen: soft, nontender, normal bowel sounds Uterine Fundus: firm, below umbilicus, nontender Perineum: healing with good reapproximation and mild edema Lochia: moderate Rectum: sm cluster pink fleshy hemorrhoids, "pea" size. Ext: Homans sign is negative, no sign of DVT and no edema, redness or tenderness in the calves or thighs Back:  Marked erythema, focal, in area where adhesive covered epidural  A/P: PPD # 1/ G2P1010/ S/P: VAVD w/ 2nd deg lac Hemorrhoids; will add proctofoam and additional colace Adhesive reaction; will add hydrocortisone cream Doing well Continue routine post partum orders Anticipate D/C home in AM    Demetrius Revel, MSN, [facility] 10/24/2012, 9:56 AM

## 2012-10-24 NOTE — Progress Notes (Signed)
Pt transferred to room 111 via wheelchair, fob at side, newborn in mothers arms.

## 2012-10-25 MED ORDER — HYDROCORTISONE 2.5 % RE CREA: TOPICAL_CREAM | Freq: Three times a day (TID) | RECTAL | Status: DC

## 2012-10-25 MED ORDER — IBUPROFEN 600 MG PO TABS: 600.0000 mg | ORAL_TABLET | Freq: Four times a day (QID) | ORAL | Status: DC

## 2012-10-25 NOTE — Progress Notes (Signed)
PPD 2 SVD  S:  Reports feeling well - ready to go home             Tolerating po/ No nausea or vomiting             Bleeding is light             Pain controlled with motrin and percocet             Up ad lib / ambulatory / voiding QS  Newborn breast feeding  / female O:               VS: BP 122/71  Pulse 88  Temp(Src) 98.4 F (36.9 C) (Oral)  Resp 18  Ht 5\' 6"  (1.676 m)  Wt 78.926 kg (174 lb)  BMI 28.1 kg/m2  SpO2 99%   LABS:  Recent Labs  10/23/12 1725 10/23/12 1850 10/24/12 0606  WBC 21.5*  --  17.5*  HGB 13.5  --  10.7*  PLT 244 234 220                       Physical Exam:             Alert and oriented X3  Abdomen: soft, non-tender, non-distended              Fundus: firm, non-tender, U-1  Perineum: mild edema / small inflamed external hemorrhoid - pink  Lochia: light  Extremities: no edema, no calf pain or tenderness    A: PPD # 2   Doing well - stable status  P:  Routine post partum orders  DC home - WOB booklet / instructions reviewed  [name] CNM, MSN, [facility] 10/25/2012, 10:36 AM

## 2012-10-25 NOTE — Discharge Summary (Signed)
Obstetric Discharge Summary   Reason for Admission: onset of labor Prenatal Procedures: NST and ultrasound Intrapartum Procedures: GBS antibiotic per protocol / epidural anesthesia / pitocin augment Postpartum Procedures: none Complications-Operative and Postpartum: marginal abruption with FHR decels / VAC assisted delivery /  2nd degree perineal laceration Hemoglobin  Date Value Range Status  10/24/2012 10.7* 12.0 - 15.0 g/dL Final     DELTA CHECK NOTED     REPEATED TO VERIFY     HCT  Date Value Range Status  10/24/2012 31.3* 36.0 - 46.0 % Final    Physical Exam:  General: alert, cooperative and no distress Lochia: appropriate Uterine Fundus: firm Incision: healing well DVT Evaluation: No evidence of DVT seen on physical exam.  Discharge Diagnoses: Term Pregnancy-delivered  Discharge Information: Date: 10/25/2012 Activity: pelvic rest Diet: routine Medications: PNV, Ibuprofen, Colace, Percocet and Proctocream HC 2.5% Condition: stable Instructions: refer to practice specific booklet Discharge to: home Follow-up Information   Follow up with Maria Berg,Maria Berg, Maria Berg. Schedule an appointment as soon as possible for a visit in 6 weeks.   Contact information:   Maria Berg [phone]       Newborn Data: Live born female  Birth Weight: 6 lb 13 oz (3090 g) Nuchal Cord at birth APGAR: 9, 9 Cord PH 7.2  Home with mother.  Maria Berg 10/25/2012, 10:41 AM

## 2012-10-27 ENCOUNTER — Inpatient Hospital Stay (HOSPITAL_COMMUNITY): Admission: RE | Admit: 2012-10-27 | Payer: BC Managed Care – PPO | Source: Ambulatory Visit

## 2012-10-27 ENCOUNTER — Encounter (HOSPITAL_COMMUNITY): Payer: BC Managed Care – PPO

## 2013-02-14 ENCOUNTER — Emergency Department (HOSPITAL_COMMUNITY)
Admission: EM | Admit: 2013-02-14 | Discharge: 2013-02-15 | Disposition: A | Discharge status: Home / Self Care | Payer: BC Managed Care – PPO | Attending: Emergency Medicine | Admitting: Emergency Medicine

## 2013-02-14 ENCOUNTER — Encounter (HOSPITAL_COMMUNITY): Payer: Self-pay | Admitting: Emergency Medicine

## 2013-02-14 DIAGNOSIS — Z8619 Personal history of other infectious and parasitic diseases: Secondary | ICD-10-CM | POA: Insufficient documentation

## 2013-02-14 DIAGNOSIS — L03019 Cellulitis of unspecified finger: Secondary | ICD-10-CM | POA: Insufficient documentation

## 2013-02-14 DIAGNOSIS — Z79899 Other long term (current) drug therapy: Secondary | ICD-10-CM | POA: Insufficient documentation

## 2013-02-14 DIAGNOSIS — L03012 Cellulitis of left finger: Secondary | ICD-10-CM

## 2013-02-14 MED ORDER — BUPIVACAINE HCL (PF) 0.5 % IJ SOLN
50.0000 mL | Freq: Once | INTRAMUSCULAR | Status: AC
  Administered 2013-02-15: 50 mL
  Filled 2013-02-14: qty 60

## 2013-02-14 NOTE — ED Provider Notes (Addendum)
CSN: [id number]     Arrival date & time 02/14/13  2225 History  This chart was scribed for non-physician practitioner, Earley Favor, FNP,working with Olivia Mackie, MD, by Karle Plumber, ED Scribe.  This patient was seen in room WTR9/WTR9 and the patient's care was started at 11:15 PM.    Chief Complaint  Patient presents with  . Extremity Pain   The history is provided by the patient. No language interpreter was used.   HPI Comments:  Maria Berg is a 32 y.o. female who presents to the Emergency Department complaining of left index finger pain. She states she has been picking at it and was having severe pain, but now the finger is numb. She reports feeling tingling in it, but denies any current pain.   Past Medical History  Diagnosis Date  . Headache(784.0)   . Pregnancy induced hypertension   . Hx of varicella   . Vacuum extraction, delivered, current hospitalization 10/24/2012  . Postpartum care following vaginal delivery (10/23/12) 10/24/2012   Past Surgical History  Procedure Laterality Date  . Wisdom tooth extraction    . Breast surgery  2005    aug  . Dilation and curettage of uterus      16 wk. w/triploidy   Family History  Problem Relation Age of Onset  . Arthritis Mother   . Asthma Father   . Cancer Father     multiple myeloma  . Depression Father   . Depression Sister   . Learning disabilities Maternal Uncle     autism  . Alcohol abuse Paternal Uncle   . Drug abuse Paternal Uncle   . Arthritis Maternal Grandmother   . Diabetes Maternal Grandfather   . Alcohol abuse Paternal Grandmother   . Cancer Paternal Grandmother   . Mental illness Paternal Grandmother   . Depression Paternal Grandmother   . Alcohol abuse Paternal Grandfather   . Asthma Paternal Grandfather   . Diabetes Paternal Grandfather   . Hearing loss Neg Hx   . Early death Neg Hx   . Heart disease Neg Hx   . Hyperlipidemia Neg Hx   . Hypertension Neg Hx   . Kidney disease Neg Hx   .  Vision loss Neg Hx   . Stroke Neg Hx   . Miscarriages / Stillbirths Neg Hx   . Mental retardation Neg Hx    History  Substance Use Topics  . Smoking status: Never Smoker   . Smokeless tobacco: Never Used  . Alcohol Use: No   OB History   Grav Para Term Preterm Abortions TAB SAB Ect Mult Living   2 1 1  0 1 1 0 0 0 0     Review of Systems  Musculoskeletal: Negative for joint swelling.  Skin: Positive for wound.  All other systems reviewed and are negative.    Allergies  Sulfa antibiotics  Home Medications   Current Outpatient Rx  Name  Route  Sig  Dispense  Refill  . ibuprofen (ADVIL,MOTRIN) 600 MG tablet   Oral   Take 1 tablet (600 mg total) by mouth every 6 (six) hours.   30 tablet   0   . Prenatal Vit-Fe Fumarate-FA (PRENATAL MULTIVITAMIN) TABS   Oral   Take 1 tablet by mouth every morning.         . propranolol (INDERAL) 20 MG tablet   Oral   Take 20 mg by mouth 2 (two) times daily.         Marland Kitchen  SUMAtriptan (IMITREX) 50 MG tablet   Oral   Take 50 mg by mouth 2 (two) times daily as needed for migraine. May repeat in 2 hours if headache persists or recurs.          Triage Vitals: BP 134/89  Pulse 77  Temp(Src) 97.9 F (36.6 C) (Oral)  Resp 20  Wt 150 lb (68.04 kg)  BMI 24.22 kg/m2  SpO2 97% Physical Exam  Nursing note and vitals reviewed. Constitutional: She appears well-developed and well-nourished.  HENT:  Head: Normocephalic.  Neck: Normal range of motion.  Cardiovascular: Normal rate.   Musculoskeletal: She exhibits tenderness. She exhibits no edema.  Patient's left index, finger, has a paronychia on the medial aspect.  She does take her cuticles is discoloration and swelling around the nail bed  Skin: Skin is warm. There is erythema.    ED Course  Drain paronychia Date/Time: 02/15/2013 12:41 AM Performed by: Arman Filter Authorized by: Arman Filter Consent: Verbal consent obtained. written consent not obtained. Risks and benefits:  risks, benefits and alternatives were discussed Consent given by: patient Patient understanding: patient states understanding of the procedure being performed Patient identity confirmed: verbally with patient Preparation: Patient was prepped and draped in the usual sterile fashion. Local anesthesia used: yes Anesthesia: digital block Local anesthetic: lidocaine 1% without epinephrine and bupivacaine 0.5% without epinephrine Anesthetic total: 1.5 ml Patient sedated: no Patient tolerance: Patient tolerated the procedure well with no immediate complications.   (including critical care time) DIAGNOSTIC STUDIES: Oxygen Saturation is 97% on RA, normal by my interpretation.   COORDINATION OF CARE: 11:21 PM- Will drain perionychia. Pt verbalizes understanding and agrees to plan.  Medications  bupivacaine (MARCAINE) 0.5 % injection 50 mL (50 mLs Infiltration Given 02/15/13 0044)   Labs Review Labs Reviewed - No data to display Imaging Review No results found.  EKG Interpretation   None     drained with an 18 gauge needle  MDM   1. Paronychia of finger of left hand        I personally performed the services described in this documentation, which was scribed in my presence. The recorded information has been reviewed and is accurate.   Arman Filter, NP 02/15/13 0042  Arman Filter, NP 02/23/13 2041  Arman Filter, NP 02/24/13 1610  Arman Filter, NP 02/27/13 2233

## 2013-02-14 NOTE — ED Notes (Signed)
Pt arrived to the ED with a complaint of a finger infection.  Pt began noticing redness and swelling on her left index finger.  Pt admits to cuticle biting and rationalizes that this is how the infection was contracted.  Pt is nursing a newborn child. Pt states tip of finger has become numb.

## 2013-02-15 NOTE — ED Provider Notes (Signed)
Medical screening examination/treatment/procedure(s) were performed by non-physician practitioner and as supervising physician I was immediately available for consultation/collaboration.  Olivia Mackie, MD 02/15/13 [phone]

## 2013-02-25 NOTE — ED Provider Notes (Signed)
Medical screening examination/treatment/procedure(s) were performed by non-physician practitioner and as supervising physician I was immediately available for consultation/collaboration.     Olivia Mackie, MD 02/25/13 [phone]

## 2013-02-28 NOTE — ED Provider Notes (Signed)
Medical screening examination/treatment/procedure(s) were performed by non-physician practitioner and as supervising physician I was immediately available for consultation/collaboration.    Olivia Mackie, MD 02/28/13 [phone]

## 2014-01-14 LAB — OB RESULTS CONSOLE HIV ANTIBODY (ROUTINE TESTING): HIV: NONREACTIVE

## 2014-01-14 LAB — OB RESULTS CONSOLE ANTIBODY SCREEN: ANTIBODY SCREEN: NEGATIVE

## 2014-01-14 LAB — OB RESULTS CONSOLE ABO/RH: RH TYPE: POSITIVE

## 2014-01-14 LAB — OB RESULTS CONSOLE HEPATITIS B SURFACE ANTIGEN: HEP B S AG: NEGATIVE

## 2014-01-14 LAB — OB RESULTS CONSOLE RPR: RPR: NONREACTIVE

## 2014-01-14 LAB — OB RESULTS CONSOLE RUBELLA ANTIBODY, IGM: Rubella: IMMUNE

## 2014-01-21 LAB — OB RESULTS CONSOLE GC/CHLAMYDIA
Chlamydia: NEGATIVE
GC PROBE AMP, GENITAL: NEGATIVE

## 2014-03-08 ENCOUNTER — Encounter (HOSPITAL_COMMUNITY): Payer: Self-pay | Admitting: Emergency Medicine

## 2014-05-07 NOTE — L&D Delivery Note (Signed)
Delivery Note At 8:13 AM a viable and healthy female was delivered via Vaginal, Spontaneous Delivery (Presentation: ; Occiput Anterior).  APGAR: 9, 9; weight  pending.   Placenta status: Intact, Spontaneous.  Cord: 3 vessels with the following complications: None.  Cord pH: na  Anesthesia: Epidural  Episiotomy: None Lacerations: 2nd degree Suture Repair: 2.0 vicryl rapide Est. Blood Loss (mL):  200  Mom to postpartum.  Baby to Couplet care / Skin to Skin.  Lanell Dubie J 08/16/2014, 8:32 AM

## 2014-07-14 LAB — OB RESULTS CONSOLE GBS: STREP GROUP B AG: POSITIVE

## 2014-08-15 ENCOUNTER — Inpatient Hospital Stay (HOSPITAL_COMMUNITY): Payer: 59 | Admitting: Anesthesiology

## 2014-08-15 ENCOUNTER — Encounter (HOSPITAL_COMMUNITY): Payer: Self-pay | Admitting: *Deleted

## 2014-08-15 ENCOUNTER — Inpatient Hospital Stay (HOSPITAL_COMMUNITY)
Admission: AD | Admit: 2014-08-15 | Discharge: 2014-08-15 | Disposition: A | Discharge status: Home / Self Care | Payer: 59 | Source: Ambulatory Visit | Attending: Obstetrics | Admitting: Obstetrics

## 2014-08-15 ENCOUNTER — Inpatient Hospital Stay (HOSPITAL_COMMUNITY)
Admission: AD | Admit: 2014-08-15 | Discharge: 2014-08-17 | DRG: 775 | Disposition: A | Discharge status: Home / Self Care | Payer: 59 | Source: Ambulatory Visit | Attending: Obstetrics and Gynecology | Admitting: Obstetrics and Gynecology

## 2014-08-15 ENCOUNTER — Encounter (HOSPITAL_COMMUNITY): Payer: Self-pay

## 2014-08-15 DIAGNOSIS — O480 Post-term pregnancy: Principal | ICD-10-CM | POA: Diagnosis present

## 2014-08-15 DIAGNOSIS — Z3A40 40 weeks gestation of pregnancy: Secondary | ICD-10-CM | POA: Insufficient documentation

## 2014-08-15 DIAGNOSIS — Z3483 Encounter for supervision of other normal pregnancy, third trimester: Secondary | ICD-10-CM | POA: Diagnosis present

## 2014-08-15 LAB — COMPREHENSIVE METABOLIC PANEL
ALT: 13 U/L (ref 0–35)
ANION GAP: 9 (ref 5–15)
AST: 19 U/L (ref 0–37)
Albumin: 3.2 g/dL — ABNORMAL LOW (ref 3.5–5.2)
Alkaline Phosphatase: 137 U/L — ABNORMAL HIGH (ref 39–117)
BUN: 11 mg/dL (ref 6–23)
CALCIUM: 9.4 mg/dL (ref 8.4–10.5)
CHLORIDE: 105 mmol/L (ref 96–112)
CO2: 21 mmol/L (ref 19–32)
CREATININE: 0.71 mg/dL (ref 0.50–1.10)
GFR calc Af Amer: >90 mL/min (ref >90)
GFR calc non Af Amer: >90 mL/min (ref >90)
Glucose, Bld: 98 mg/dL (ref 70–99)
Potassium: 3.8 mmol/L (ref 3.5–5.1)
Sodium: 135 mmol/L (ref 135–145)
TOTAL PROTEIN: 6.7 g/dL (ref 6.0–8.3)
Total Bilirubin: 0.2 mg/dL — ABNORMAL LOW (ref 0.3–1.2)

## 2014-08-15 LAB — PROTEIN / CREATININE RATIO, URINE
Creatinine, Urine: 24 mg/dL
Total Protein, Urine: <6 mg/dL

## 2014-08-15 LAB — CBC
HCT: 36.3 % (ref 36.0–46.0)
Hemoglobin: 12.5 g/dL (ref 12.0–15.0)
MCH: 31.1 pg (ref 26.0–34.0)
MCHC: 34.4 g/dL (ref 30.0–36.0)
MCV: 90.3 fL (ref 78.0–100.0)
PLATELETS: 220 10*3/uL (ref 150–400)
RBC: 4.02 MIL/uL (ref 3.87–5.11)
RDW: 12.7 % (ref 11.5–15.5)
WBC: 10.2 10*3/uL (ref 4.0–10.5)

## 2014-08-15 LAB — URIC ACID: URIC ACID, SERUM: 5.3 mg/dL (ref 2.4–7.0)

## 2014-08-15 LAB — LACTATE DEHYDROGENASE: LDH: 126 U/L (ref 94–250)

## 2014-08-15 MED ORDER — DIPHENHYDRAMINE HCL 50 MG/ML IJ SOLN: 12.5000 mg | INTRAMUSCULAR | Status: DC | PRN

## 2014-08-15 MED ORDER — LIDOCAINE HCL (PF) 1 % IJ SOLN
INTRAMUSCULAR | Status: DC | PRN
  Administered 2014-08-15: 10 mL

## 2014-08-15 MED ORDER — PHENYLEPHRINE 40 MCG/ML (10ML) SYRINGE FOR IV PUSH (FOR BLOOD PRESSURE SUPPORT)
80.0000 ug | PREFILLED_SYRINGE | INTRAVENOUS | Status: DC | PRN
  Filled 2014-08-15: qty 2
  Filled 2014-08-15: qty 20

## 2014-08-15 MED ORDER — OXYCODONE-ACETAMINOPHEN 5-325 MG PO TABS
2.0000 | ORAL_TABLET | ORAL | Status: DC | PRN
Start: 2014-08-15 — End: 2014-08-16

## 2014-08-15 MED ORDER — LIDOCAINE HCL (PF) 1 % IJ SOLN
30.0000 mL | INTRAMUSCULAR | Status: DC | PRN
  Filled 2014-08-15: qty 30

## 2014-08-15 MED ORDER — ONDANSETRON HCL 4 MG/2ML IJ SOLN: 4.0000 mg | Freq: Four times a day (QID) | INTRAMUSCULAR | Status: DC | PRN

## 2014-08-15 MED ORDER — FENTANYL 2.5 MCG/ML BUPIVACAINE 1/10 % EPIDURAL INFUSION (WH - ANES)
14.0000 mL/h | INTRAMUSCULAR | Status: DC | PRN
  Administered 2014-08-15 – 2014-08-16 (×2): 14 mL/h via EPIDURAL
  Filled 2014-08-15 (×2): qty 125

## 2014-08-15 MED ORDER — FLEET ENEMA 7-19 GM/118ML RE ENEM: 1.0000 | ENEMA | RECTAL | Status: DC | PRN

## 2014-08-15 MED ORDER — OXYTOCIN BOLUS FROM INFUSION
500.0000 mL | INTRAVENOUS | Status: DC
  Administered 2014-08-16: 500 mL via INTRAVENOUS

## 2014-08-15 MED ORDER — LACTATED RINGERS IV SOLN
INTRAVENOUS | Status: DC
  Administered 2014-08-15: 23:00:00 via INTRAVENOUS

## 2014-08-15 MED ORDER — SODIUM CHLORIDE 0.9 % IV SOLN
2.0000 g | Freq: Once | INTRAVENOUS | Status: AC
  Administered 2014-08-15: 2 g via INTRAVENOUS
  Filled 2014-08-15: qty 2000

## 2014-08-15 MED ORDER — LACTATED RINGERS IV SOLN
500.0000 mL | Freq: Once | INTRAVENOUS | Status: AC
  Administered 2014-08-15: 500 mL via INTRAVENOUS

## 2014-08-15 MED ORDER — EPHEDRINE 5 MG/ML INJ
10.0000 mg | INTRAVENOUS | Status: DC | PRN
  Filled 2014-08-15: qty 2

## 2014-08-15 MED ORDER — LACTATED RINGERS IV SOLN: 500.0000 mL | INTRAVENOUS | Status: DC | PRN

## 2014-08-15 MED ORDER — OXYCODONE-ACETAMINOPHEN 5-325 MG PO TABS
1.0000 | ORAL_TABLET | ORAL | Status: DC | PRN
Start: 2014-08-15 — End: 2014-08-16

## 2014-08-15 MED ORDER — ACETAMINOPHEN 325 MG PO TABS
650.0000 mg | ORAL_TABLET | ORAL | Status: DC | PRN
  Administered 2014-08-16: 650 mg via ORAL
  Filled 2014-08-15: qty 2

## 2014-08-15 MED ORDER — PHENYLEPHRINE 40 MCG/ML (10ML) SYRINGE FOR IV PUSH (FOR BLOOD PRESSURE SUPPORT)
80.0000 ug | PREFILLED_SYRINGE | INTRAVENOUS | Status: DC | PRN
  Filled 2014-08-15: qty 2

## 2014-08-15 MED ORDER — CITRIC ACID-SODIUM CITRATE 334-500 MG/5ML PO SOLN
30.0000 mL | ORAL | Status: DC | PRN
  Administered 2014-08-16: 30 mL via ORAL
  Filled 2014-08-15: qty 15

## 2014-08-15 MED ORDER — OXYTOCIN 40 UNITS IN LACTATED RINGERS INFUSION - SIMPLE MED
62.5000 mL/h | INTRAVENOUS | Status: DC
  Filled 2014-08-15: qty 1000

## 2014-08-15 MED ORDER — FENTANYL 2.5 MCG/ML BUPIVACAINE 1/10 % EPIDURAL INFUSION (WH - ANES)
INTRAMUSCULAR | Status: DC | PRN
  Administered 2014-08-15: 14 mL/h via EPIDURAL

## 2014-08-15 NOTE — Discharge Instructions (Signed)
Fetal Movement Counts °Patient Name: __________________________________________________ Patient Due Date: ____________________ °Performing a fetal movement count is highly recommended in high-risk pregnancies, but it is good for every pregnant woman to do. Your health care provider may ask you to start counting fetal movements at 28 weeks of the pregnancy. Fetal movements often increase: °· After eating a full meal. °· After physical activity. °· After eating or drinking something sweet or cold. °· At rest. °Pay attention to when you feel the baby is most active. This will help you notice a pattern of your baby's sleep and wake cycles and what factors contribute to an increase in fetal movement. It is important to perform a fetal movement count at the same time each day when your baby is normally most active.  °HOW TO COUNT FETAL MOVEMENTS °1. Find a quiet and comfortable area to sit or lie down on your left side. Lying on your left side provides the best blood and oxygen circulation to your baby. °2. Write down the day and time on a sheet of paper or in a journal. °3. Start counting kicks, flutters, swishes, rolls, or jabs in a 2-hour period. You should feel at least 10 movements within 2 hours. °4. If you do not feel 10 movements in 2 hours, wait 2-3 hours and count again. Look for a change in the pattern or not enough counts in 2 hours. °SEEK MEDICAL CARE IF: °· You feel less than 10 counts in 2 hours, tried twice. °· There is no movement in over an hour. °· The pattern is changing or taking longer each day to reach 10 counts in 2 hours. °· You feel the baby is not moving as he or she usually does. °Date: ____________ Movements: ____________ Start time: ____________ Finish time: ____________  °Date: ____________ Movements: ____________ Start time: ____________ Finish time: ____________ °Date: ____________ Movements: ____________ Start time: ____________ Finish time: ____________ °Date: ____________ Movements:  ____________ Start time: ____________ Finish time: ____________ °Date: ____________ Movements: ____________ Start time: ____________ Finish time: ____________ °Date: ____________ Movements: ____________ Start time: ____________ Finish time: ____________ °Date: ____________ Movements: ____________ Start time: ____________ Finish time: ____________ °Date: ____________ Movements: ____________ Start time: ____________ Finish time: ____________  °Date: ____________ Movements: ____________ Start time: ____________ Finish time: ____________ °Date: ____________ Movements: ____________ Start time: ____________ Finish time: ____________ °Date: ____________ Movements: ____________ Start time: ____________ Finish time: ____________ °Date: ____________ Movements: ____________ Start time: ____________ Finish time: ____________ °Date: ____________ Movements: ____________ Start time: ____________ Finish time: ____________ °Date: ____________ Movements: ____________ Start time: ____________ Finish time: ____________ °Date: ____________ Movements: ____________ Start time: ____________ Finish time: ____________  °Date: ____________ Movements: ____________ Start time: ____________ Finish time: ____________ °Date: ____________ Movements: ____________ Start time: ____________ Finish time: ____________ °Date: ____________ Movements: ____________ Start time: ____________ Finish time: ____________ °Date: ____________ Movements: ____________ Start time: ____________ Finish time: ____________ °Date: ____________ Movements: ____________ Start time: ____________ Finish time: ____________ °Date: ____________ Movements: ____________ Start time: ____________ Finish time: ____________ °Date: ____________ Movements: ____________ Start time: ____________ Finish time: ____________  °Date: ____________ Movements: ____________ Start time: ____________ Finish time: ____________ °Date: ____________ Movements: ____________ Start time: ____________ Finish  time: ____________ °Date: ____________ Movements: ____________ Start time: ____________ Finish time: ____________ °Date: ____________ Movements: ____________ Start time: ____________ Finish time: ____________ °Date: ____________ Movements: ____________ Start time: ____________ Finish time: ____________ °Date: ____________ Movements: ____________ Start time: ____________ Finish time: ____________ °Date: ____________ Movements: ____________ Start time: ____________ Finish time: ____________  °Date: ____________ Movements: ____________ Start time: ____________ Finish   time: ____________ °Date: ____________ Movements: ____________ Start time: ____________ Finish time: ____________ °Date: ____________ Movements: ____________ Start time: ____________ Finish time: ____________ °Date: ____________ Movements: ____________ Start time: ____________ Finish time: ____________ °Date: ____________ Movements: ____________ Start time: ____________ Finish time: ____________ °Date: ____________ Movements: ____________ Start time: ____________ Finish time: ____________ °Date: ____________ Movements: ____________ Start time: ____________ Finish time: ____________  °Date: ____________ Movements: ____________ Start time: ____________ Finish time: ____________ °Date: ____________ Movements: ____________ Start time: ____________ Finish time: ____________ °Date: ____________ Movements: ____________ Start time: ____________ Finish time: ____________ °Date: ____________ Movements: ____________ Start time: ____________ Finish time: ____________ °Date: ____________ Movements: ____________ Start time: ____________ Finish time: ____________ °Date: ____________ Movements: ____________ Start time: ____________ Finish time: ____________ °Date: ____________ Movements: ____________ Start time: ____________ Finish time: ____________  °Date: ____________ Movements: ____________ Start time: ____________ Finish time: ____________ °Date: ____________  Movements: ____________ Start time: ____________ Finish time: ____________ °Date: ____________ Movements: ____________ Start time: ____________ Finish time: ____________ °Date: ____________ Movements: ____________ Start time: ____________ Finish time: ____________ °Date: ____________ Movements: ____________ Start time: ____________ Finish time: ____________ °Date: ____________ Movements: ____________ Start time: ____________ Finish time: ____________ °Date: ____________ Movements: ____________ Start time: ____________ Finish time: ____________  °Date: ____________ Movements: ____________ Start time: ____________ Finish time: ____________ °Date: ____________ Movements: ____________ Start time: ____________ Finish time: ____________ °Date: ____________ Movements: ____________ Start time: ____________ Finish time: ____________ °Date: ____________ Movements: ____________ Start time: ____________ Finish time: ____________ °Date: ____________ Movements: ____________ Start time: ____________ Finish time: ____________ °Date: ____________ Movements: ____________ Start time: ____________ Finish time: ____________ °Document Released: 05/23/2006 Document Revised: 09/07/2013 Document Reviewed: 02/18/2012 °[employer]® Patient Information ©2015 [employer]. This information is not intended to replace advice given to you by your health care provider. Make sure you discuss any questions you have with your health care provider. °Braxton Hicks Contractions °Contractions of the uterus can occur throughout pregnancy. Contractions are not always a sign that you are in labor.  °WHAT ARE BRAXTON HICKS CONTRACTIONS?  °Contractions that occur before labor are called Braxton Hicks contractions, or false labor. Toward the end of pregnancy (32-34 weeks), these contractions can develop more often and may become more forceful. This is not true labor because these contractions do not result in opening (dilatation) and thinning of the cervix. They  are sometimes difficult to tell apart from true labor because these contractions can be forceful and people have different pain tolerances. You should not feel embarrassed if you go to the hospital with false labor. Sometimes, the only way to tell if you are in true labor is for your health care provider to look for changes in the cervix. °If there are no prenatal problems or other health problems associated with the pregnancy, it is completely safe to be sent home with false labor and await the onset of true labor. °HOW CAN YOU TELL THE DIFFERENCE BETWEEN TRUE AND FALSE LABOR? °False Labor °· The contractions of false labor are usually shorter and not as hard as those of true labor.   °· The contractions are usually irregular.   °· The contractions are often felt in the front of the lower abdomen and in the groin.   °· The contractions may go away when you walk around or change positions while lying down.   °· The contractions get weaker and are shorter lasting as time goes on.   °· The contractions do not usually become progressively stronger, regular, and closer together as with true labor.   °True Labor °5. Contractions in true labor last 30-70 seconds, become   very regular, usually become more intense, and increase in frequency.   °6. The contractions do not go away with walking.   °7. The discomfort is usually felt in the top of the uterus and spreads to the lower abdomen and low back.   °8. True labor can be determined by your health care provider with an exam. This will show that the cervix is dilating and getting thinner.   °WHAT TO REMEMBER °· Keep up with your usual exercises and follow other instructions given by your health care provider.   °· Take medicines as directed by your health care provider.   °· Keep your regular prenatal appointments.   °· Eat and drink lightly if you think you are going into labor.   °· If Braxton Hicks contractions are making you uncomfortable:   °· Change your position from  lying down or resting to walking, or from walking to resting.   °· Sit and rest in a tub of warm water.   °· Drink 2-3 glasses of water. Dehydration may cause these contractions.   °· Do slow and deep breathing several times an hour.   °WHEN SHOULD I SEEK IMMEDIATE MEDICAL CARE? °Seek immediate medical care if: °· Your contractions become stronger, more regular, and closer together.   °· You have fluid leaking or gushing from your vagina.   °· You have a fever.   °· You pass blood-tinged mucus.   °· You have vaginal bleeding.   °· You have continuous abdominal pain.   °· You have low back pain that you never had before.   °· You feel your baby's head pushing down and causing pelvic pressure.   °· Your baby is not moving as much as it used to.   °Document Released: 04/23/2005 Document Revised: 04/28/2013 Document Reviewed: 02/02/2013 °[employer]® Patient Information ©2015 [employer]. This information is not intended to replace advice given to you by your health care provider. Make sure you discuss any questions you have with your health care provider. ° °

## 2014-08-15 NOTE — H&P (Signed)
34 yo G3P1011 for evaluation of possible ROM. Pt notes large fluid leak at 8am, no leaking since. Mild contractions. No VB, active FM.  Prior SVD w/ partial terminal abruption and VAVD.  Uncomplicated preg, planning natural labor, GBS pos  Past Medical History  Diagnosis Date  . Headache(784.0)   . Pregnancy induced hypertension   . Hx of varicella   . Vacuum extraction, delivered, current hospitalization 10/24/2012  . Postpartum care following vaginal delivery (10/23/12) 10/24/2012    Past Surgical History  Procedure Laterality Date  . Wisdom tooth extraction    . Breast surgery  2005    aug  . Dilation and curettage of uterus      16 wk. w/triploidy    Meds: PNV  O: Filed Vitals:   08/15/14 1320  BP:   Pulse: 112  Temp:   Resp:    Filed Vitals:   08/15/14 1305 08/15/14 1310 08/15/14 1315 08/15/14 1320  BP: 134/93     Pulse: 103 114 105 112  Temp:      TempSrc:      Resp:      SpO2: 100% 99% 99% 100%   Gen: well appearing, no distress Abd: soft, NT, ND GU: pool neg, fern neg, cvx 4/50%/ vtx -2 Toco: q 3 cm Fh: 140s, + accels, no decels, 10 beat var  Dry slide: fern neg  A/P: Latent labor, no evidence ROM - d/c home, d/w pt importance of returning with active labor or ROM in order to allow adequate time for  GBS prophylaxis. Pt asks to d/c home now. - reactive fetal testing.   Maria Berg A. 08/15/2014 1:38 PM

## 2014-08-15 NOTE — MAU Note (Signed)
Pt. Back from walking. EFM applied. Pt. States contractions are still strong but feel further apart.

## 2014-08-15 NOTE — Treatment Plan (Signed)
Telephone call to Dr Tiburcio Bash, reviewed patients complaints.  Orders for [facility] labs.  Monitor baby x 30 minutes, walk x 1 hr.  [facility] labs

## 2014-08-15 NOTE — MAU Note (Signed)
contractions 

## 2014-08-15 NOTE — Anesthesia Procedure Notes (Signed)
Epidural Patient location during procedure: OB Start time: 08/15/2014 10:57 PM End time: 08/15/2014 11:09 PM  Staffing Anesthesiologist: Alexis Frock  Preanesthetic Checklist Completed: patient identified, site marked, surgical consent, pre-op evaluation, timeout performed, IV checked, risks and benefits discussed and monitors and equipment checked  Epidural Patient position: sitting Prep: site prepped and draped and DuraPrep Patient monitoring: heart rate, continuous pulse ox and blood pressure Approach: midline Location: L3-L4 Injection technique: LOR air  Needle:  Needle type: Tuohy  Needle gauge: 17 G Needle length: 9 cm and 9 Needle insertion depth: 6 cm Catheter type: closed end flexible Catheter size: 19 Gauge Catheter at skin depth: 13 cm Test dose: negative  Assessment Events: blood not aspirated, injection not painful, no injection resistance, negative IV test and no paresthesia  Additional Notes   Patient tolerated the insertion well without complications.Reason for block:procedure for pain

## 2014-08-15 NOTE — MAU Note (Signed)
Pt. And SO agreeable to discharge with labor precautions at this time due to no cervical change at this time. Pt. Given precautions. Aware to keep scheduled follow up if labor does not progress.

## 2014-08-15 NOTE — MAU Note (Signed)
Patient arrived to MAU with c/o contractions every 2 minutes; states was seen a couple hours earlier in MAU. Patient taken directly from lobby to MAU room. EFM 120's-130's. SVE 6.5cm/80%. GBS+. Patient stating would like an epidural. Dr. Ronita Hipps notifed; IV started. Patient taken directly to L&D room via wheelchair by RN and NT.

## 2014-08-15 NOTE — Discharge Instructions (Signed)
Third Trimester of Pregnancy The third trimester is from week 29 through week 42, months 7 through 9. The third trimester is a time when the fetus is growing rapidly. At the end of the ninth month, the fetus is about 20 inches in length and weighs 6-10 pounds.  BODY CHANGES Your body goes through many changes during pregnancy. The changes vary from woman to woman.   Your weight will continue to increase. You can expect to gain 25-35 pounds (11-16 kg) by the end of the pregnancy.  You may begin to get stretch marks on your hips, abdomen, and breasts.  You may urinate more often because the fetus is moving lower into your pelvis and pressing on your bladder.  You may develop or continue to have heartburn as a result of your pregnancy.  You may develop constipation because certain hormones are causing the muscles that push waste through your intestines to slow down.  You may develop hemorrhoids or swollen, bulging veins (varicose veins).  You may have pelvic pain because of the weight gain and pregnancy hormones relaxing your joints between the bones in your pelvis. Backaches may result from overexertion of the muscles supporting your posture.  You may have changes in your hair. These can include thickening of your hair, rapid growth, and changes in texture. Some women also have hair loss during or after pregnancy, or hair that feels dry or thin. Your hair will most likely return to normal after your baby is born.  Your breasts will continue to grow and be tender. A yellow discharge may leak from your breasts called colostrum.  Your belly button may stick out.  You may feel short of breath because of your expanding uterus.  You may notice the fetus "dropping," or moving lower in your abdomen.  You may have a bloody mucus discharge. This usually occurs a few days to a week before labor begins.  Your cervix becomes thin and soft (effaced) near your due date. WHAT TO EXPECT AT YOUR PRENATAL  EXAMS  You will have prenatal exams every 2 weeks until week 36. Then, you will have weekly prenatal exams. During a routine prenatal visit:  You will be weighed to make sure you and the fetus are growing normally.  Your blood pressure is taken.  Your abdomen will be measured to track your baby's growth.  The fetal heartbeat will be listened to.  Any test results from the previous visit will be discussed.  You may have a cervical check near your due date to see if you have effaced. At around 36 weeks, your caregiver will check your cervix. At the same time, your caregiver will also perform a test on the secretions of the vaginal tissue. This test is to determine if a type of bacteria, Group B streptococcus, is present. Your caregiver will explain this further. Your caregiver may ask you:  What your birth plan is.  How you are feeling.  If you are feeling the baby move.  If you have had any abnormal symptoms, such as leaking fluid, bleeding, severe headaches, or abdominal cramping.  If you have any questions. Other tests or screenings that may be performed during your third trimester include:  Blood tests that check for low iron levels (anemia).  Fetal testing to check the health, activity level, and growth of the fetus. Testing is done if you have certain medical conditions or if there are problems during the pregnancy. FALSE LABOR You may feel small, irregular contractions that  eventually go away. These are called Braxton Hicks contractions, or false labor. Contractions may last for hours, days, or even weeks before true labor sets in. If contractions come at regular intervals, intensify, or become painful, it is best to be seen by your caregiver.  SIGNS OF LABOR   Menstrual-like cramps.  Contractions that are 5 minutes apart or less.  Contractions that start on the top of the uterus and spread down to the lower abdomen and back.  A sense of increased pelvic pressure or back  pain.  A watery or bloody mucus discharge that comes from the vagina. If you have any of these signs before the 37th week of pregnancy, call your caregiver right away. You need to go to the hospital to get checked immediately. HOME CARE INSTRUCTIONS   Avoid all smoking, herbs, alcohol, and unprescribed drugs. These chemicals affect the formation and growth of the baby.  Follow your caregiver's instructions regarding medicine use. There are medicines that are either safe or unsafe to take during pregnancy.  Exercise only as directed by your caregiver. Experiencing uterine cramps is a good sign to stop exercising.  Continue to eat regular, healthy meals.  Wear a good support bra for breast tenderness.  Do not use hot tubs, steam rooms, or saunas.  Wear your seat belt at all times when driving.  Avoid raw meat, uncooked cheese, cat litter boxes, and soil used by cats. These carry germs that can cause birth defects in the baby.  Take your prenatal vitamins.  Try taking a stool softener (if your caregiver approves) if you develop constipation. Eat more high-fiber foods, such as fresh vegetables or fruit and whole grains. Drink plenty of fluids to keep your urine clear or pale yellow.  Take warm sitz baths to soothe any pain or discomfort caused by hemorrhoids. Use hemorrhoid cream if your caregiver approves.  If you develop varicose veins, wear support hose. Elevate your feet for 15 minutes, 3-4 times a day. Limit salt in your diet.  Avoid heavy lifting, wear low heal shoes, and practice good posture.  Rest a lot with your legs elevated if you have leg cramps or low back pain.  Visit your dentist if you have not gone during your pregnancy. Use a soft toothbrush to brush your teeth and be gentle when you floss.  A sexual relationship may be continued unless your caregiver directs you otherwise.  Do not travel far distances unless it is absolutely necessary and only with the approval  of your caregiver.  Take prenatal classes to understand, practice, and ask questions about the labor and delivery.  Make a trial run to the hospital.  Pack your hospital bag.  Prepare the baby's nursery.  Continue to go to all your prenatal visits as directed by your caregiver. SEEK MEDICAL CARE IF:  You are unsure if you are in labor or if your water has broken.  You have dizziness.  You have mild pelvic cramps, pelvic pressure, or nagging pain in your abdominal area.  You have persistent nausea, vomiting, or diarrhea.  You have a bad smelling vaginal discharge.  You have pain with urination. SEEK IMMEDIATE MEDICAL CARE IF:   You have a fever.  You are leaking fluid from your vagina.  You have spotting or bleeding from your vagina.  You have severe abdominal cramping or pain.  You have rapid weight loss or gain.  You have shortness of breath with chest pain.  You notice sudden or extreme swelling  of your face, hands, ankles, feet, or legs.  You have not felt your baby move in over an hour.  You have severe headaches that do not go away with medicine.  You have vision changes. Document Released: 04/17/2001 Document Revised: 04/28/2013 Document Reviewed: 06/24/2012 [facility] Patient Information 2015 [place], [place]. This information is not intended to replace advice given to you by your health care provider. Make sure you discuss any questions you have with your health care provider. Fetal Movement Counts Patient Name: __________________________________________________ Patient Due Date: ____________________ Performing a fetal movement count is highly recommended in high-risk pregnancies, but it is good for every pregnant woman to do. Your health care provider may ask you to start counting fetal movements at 28 weeks of the pregnancy. Fetal movements often increase:  After eating a full meal.  After physical activity.  After eating or drinking something sweet or  cold.  At rest. Pay attention to when you feel the baby is most active. This will help you notice a pattern of your baby's sleep and wake cycles and what factors contribute to an increase in fetal movement. It is important to perform a fetal movement count at the same time each day when your baby is normally most active.  HOW TO COUNT FETAL MOVEMENTS  Find a quiet and comfortable area to sit or lie down on your left side. Lying on your left side provides the best blood and oxygen circulation to your baby.  Write down the day and time on a sheet of paper or in a journal.  Start counting kicks, flutters, swishes, rolls, or jabs in a 2-hour period. You should feel at least 10 movements within 2 hours.  If you do not feel 10 movements in 2 hours, wait 2-3 hours and count again. Look for a change in the pattern or not enough counts in 2 hours. SEEK MEDICAL CARE IF:  You feel less than 10 counts in 2 hours, tried twice.  There is no movement in over an hour.  The pattern is changing or taking longer each day to reach 10 counts in 2 hours.  You feel the baby is not moving as he or she usually does. Date: ____________ Movements: ____________ Start time: ____________ Elizebeth Koller time: ____________  Date: ____________ Movements: ____________ Start time: ____________ Elizebeth Koller time: ____________ Date: ____________ Movements: ____________ Start time: ____________ Elizebeth Koller time: ____________ Date: ____________ Movements: ____________ Start time: ____________ Elizebeth Koller time: ____________ Date: ____________ Movements: ____________ Start time: ____________ Elizebeth Koller time: ____________ Date: ____________ Movements: ____________ Start time: ____________ Elizebeth Koller time: ____________ Date: ____________ Movements: ____________ Start time: ____________ Elizebeth Koller time: ____________ Date: ____________ Movements: ____________ Start time: ____________ Elizebeth Koller time: ____________  Date: ____________ Movements: ____________ Start time:  ____________ Elizebeth Koller time: ____________ Date: ____________ Movements: ____________ Start time: ____________ Elizebeth Koller time: ____________ Date: ____________ Movements: ____________ Start time: ____________ Elizebeth Koller time: ____________ Date: ____________ Movements: ____________ Start time: ____________ Elizebeth Koller time: ____________ Date: ____________ Movements: ____________ Start time: ____________ Elizebeth Koller time: ____________ Date: ____________ Movements: ____________ Start time: ____________ Elizebeth Koller time: ____________ Date: ____________ Movements: ____________ Start time: ____________ Elizebeth Koller time: ____________  Date: ____________ Movements: ____________ Start time: ____________ Elizebeth Koller time: ____________ Date: ____________ Movements: ____________ Start time: ____________ Elizebeth Koller time: ____________ Date: ____________ Movements: ____________ Start time: ____________ Elizebeth Koller time: ____________ Date: ____________ Movements: ____________ Start time: ____________ Elizebeth Koller time: ____________ Date: ____________ Movements: ____________ Start time: ____________ Elizebeth Koller time: ____________ Date: ____________ Movements: ____________ Start time: ____________ Elizebeth Koller time: ____________ Date: ____________ Movements: ____________ Start time: ____________ Elizebeth Koller time:  ____________  Date: ____________ Movements: ____________ Start time: ____________ Elizebeth Koller time: ____________ Date: ____________ Movements: ____________ Start time: ____________ Elizebeth Koller time: ____________ Date: ____________ Movements: ____________ Start time: ____________ Elizebeth Koller time: ____________ Date: ____________ Movements: ____________ Start time: ____________ Elizebeth Koller time: ____________ Date: ____________ Movements: ____________ Start time: ____________ Elizebeth Koller time: ____________ Date: ____________ Movements: ____________ Start time: ____________ Elizebeth Koller time: ____________ Date: ____________ Movements: ____________ Start time: ____________ Elizebeth Koller time: ____________  Date:  ____________ Movements: ____________ Start time: ____________ Elizebeth Koller time: ____________ Date: ____________ Movements: ____________ Start time: ____________ Elizebeth Koller time: ____________ Date: ____________ Movements: ____________ Start time: ____________ Elizebeth Koller time: ____________ Date: ____________ Movements: ____________ Start time: ____________ Elizebeth Koller time: ____________ Date: ____________ Movements: ____________ Start time: ____________ Elizebeth Koller time: ____________ Date: ____________ Movements: ____________ Start time: ____________ Elizebeth Koller time: ____________ Date: ____________ Movements: ____________ Start time: ____________ Elizebeth Koller time: ____________  Date: ____________ Movements: ____________ Start time: ____________ Elizebeth Koller time: ____________ Date: ____________ Movements: ____________ Start time: ____________ Elizebeth Koller time: ____________ Date: ____________ Movements: ____________ Start time: ____________ Elizebeth Koller time: ____________ Date: ____________ Movements: ____________ Start time: ____________ Elizebeth Koller time: ____________ Date: ____________ Movements: ____________ Start time: ____________ Elizebeth Koller time: ____________ Date: ____________ Movements: ____________ Start time: ____________ Elizebeth Koller time: ____________ Date: ____________ Movements: ____________ Start time: ____________ Elizebeth Koller time: ____________  Date: ____________ Movements: ____________ Start time: ____________ Elizebeth Koller time: ____________ Date: ____________ Movements: ____________ Start time: ____________ Elizebeth Koller time: ____________ Date: ____________ Movements: ____________ Start time: ____________ Elizebeth Koller time: ____________ Date: ____________ Movements: ____________ Start time: ____________ Elizebeth Koller time: ____________ Date: ____________ Movements: ____________ Start time: ____________ Elizebeth Koller time: ____________ Date: ____________ Movements: ____________ Start time: ____________ Elizebeth Koller time: ____________ Date: ____________ Movements: ____________ Start  time: ____________ Elizebeth Koller time: ____________  Date: ____________ Movements: ____________ Start time: ____________ Elizebeth Koller time: ____________ Date: ____________ Movements: ____________ Start time: ____________ Elizebeth Koller time: ____________ Date: ____________ Movements: ____________ Start time: ____________ Elizebeth Koller time: ____________ Date: ____________ Movements: ____________ Start time: ____________ Elizebeth Koller time: ____________ Date: ____________ Movements: ____________ Start time: ____________ Elizebeth Koller time: ____________ Date: ____________ Movements: ____________ Start time: ____________ Elizebeth Koller time: ____________ Document Released: 05/23/2006 Document Revised: 09/07/2013 Document Reviewed: 02/18/2012 [employer] Patient Information 2015 [employer]. This information is not intended to replace advice given to you by your health care provider. Make sure you discuss any questions you have with your health care provider. Braxton Hicks Contractions Contractions of the uterus can occur throughout pregnancy. Contractions are not always a sign that you are in labor.  WHAT ARE BRAXTON HICKS CONTRACTIONS?  Contractions that occur before labor are called Braxton Hicks contractions, or false labor. Toward the end of pregnancy (32-34 weeks), these contractions can develop more often and may become more forceful. This is not true labor because these contractions do not result in opening (dilatation) and thinning of the cervix. They are sometimes difficult to tell apart from true labor because these contractions can be forceful and people have different pain tolerances. You should not feel embarrassed if you go to the hospital with false labor. Sometimes, the only way to tell if you are in true labor is for your health care provider to look for changes in the cervix. If there are no prenatal problems or other health problems associated with the pregnancy, it is completely safe to be sent home with false labor and await the  onset of true labor. HOW CAN YOU TELL THE DIFFERENCE BETWEEN TRUE AND FALSE LABOR? False Labor  The contractions of false labor are usually shorter and not as hard as those of true labor.   The contractions  are usually irregular.   The contractions are often felt in the front of the lower abdomen and in the groin.   The contractions may go away when you walk around or change positions while lying down.   The contractions get weaker and are shorter lasting as time goes on.   The contractions do not usually become progressively stronger, regular, and closer together as with true labor.  True Labor  Contractions in true labor last 30-70 seconds, become very regular, usually become more intense, and increase in frequency.   The contractions do not go away with walking.   The discomfort is usually felt in the top of the uterus and spreads to the lower abdomen and low back.   True labor can be determined by your health care provider with an exam. This will show that the cervix is dilating and getting thinner.  WHAT TO REMEMBER  Keep up with your usual exercises and follow other instructions given by your health care provider.   Take medicines as directed by your health care provider.   Keep your regular prenatal appointments.   Eat and drink lightly if you think you are going into labor.   If Braxton Hicks contractions are making you uncomfortable:   Change your position from lying down or resting to walking, or from walking to resting.   Sit and rest in a tub of warm water.   Drink 2-3 glasses of water. Dehydration may cause these contractions.   Do slow and deep breathing several times an hour.  WHEN SHOULD I SEEK IMMEDIATE MEDICAL CARE? Seek immediate medical care if:  Your contractions become stronger, more regular, and closer together.   You have fluid leaking or gushing from your vagina.   You have a fever.   You pass blood-tinged mucus.    You have vaginal bleeding.   You have continuous abdominal pain.   You have low back pain that you never had before.   You feel your baby's head pushing down and causing pelvic pressure.   Your baby is not moving as much as it used to.  Document Released: 04/23/2005 Document Revised: 04/28/2013 Document Reviewed: 02/02/2013 [facility] Patient Information 2015 [place], [place]. This information is not intended to replace advice given to you by your health care provider. Make sure you discuss any questions you have with your health care provider.

## 2014-08-15 NOTE — Anesthesia Preprocedure Evaluation (Signed)
Anesthesia Evaluation  Patient identified by MRN, date of birth, ID band Patient awake and Patient confused    Reviewed: Allergy & Precautions, H&P , NPO status , Patient's Chart, lab work & pertinent test results  Airway Mallampati: II   Neck ROM: full    Dental  (+) Teeth Intact   Pulmonary  breath sounds clear to auscultation  Pulmonary exam normal       Cardiovascular Exercise Tolerance: Good hypertension, Rhythm:regular Rate:Normal     Neuro/Psych    GI/Hepatic   Endo/Other    Renal/GU      Musculoskeletal   Abdominal Normal abdominal exam  (+)   Peds  Hematology   Anesthesia Other Findings   Reproductive/Obstetrics (+) Pregnancy                             Anesthesia Physical Anesthesia Plan  ASA: II  Anesthesia Plan: Epidural   Post-op Pain Management:    Induction:   Airway Management Planned:   Additional Equipment:   Intra-op Plan:   Post-operative Plan:   Informed Consent: I have reviewed the patients History and Physical, chart, labs and discussed the procedure including the risks, benefits and alternatives for the proposed anesthesia with the patient or authorized representative who has indicated his/her understanding and acceptance.     Plan Discussed with:   Anesthesia Plan Comments:         Anesthesia Quick Evaluation

## 2014-08-15 NOTE — H&P (Signed)
Maria Berg is a 34 y.o. female presenting for labor. Maternal Medical History:  Contractions: Frequency: regular.   Perceived severity is moderate.    Fetal activity: Perceived fetal activity is normal.   Last perceived fetal movement was within the past hour.    Prenatal complications: no prenatal complications Prenatal Complications - Diabetes: none.    OB History    Gravida Para Term Preterm AB TAB SAB Ectopic Multiple Living   3 1 1  0 1 1 0 0 0 0     Past Medical History  Diagnosis Date  . Headache(784.0)   . Pregnancy induced hypertension   . Hx of varicella   . Vacuum extraction, delivered, current hospitalization 10/24/2012  . Postpartum care following vaginal delivery (10/23/12) 10/24/2012   Past Surgical History  Procedure Laterality Date  . Wisdom tooth extraction    . Breast surgery  2005    aug  . Dilation and curettage of uterus      16 wk. w/triploidy   Family History: family history includes Alcohol abuse in her paternal grandfather, paternal grandmother, and paternal uncle; Arthritis in her maternal grandmother and mother; Asthma in her father and paternal grandfather; Cancer in her father and paternal grandmother; Depression in her father, paternal grandmother, and sister; Diabetes in her maternal grandfather and paternal grandfather; Drug abuse in her paternal uncle; Learning disabilities in her maternal uncle; Mental illness in her paternal grandmother. There is no history of Hearing loss, Early death, Heart disease, Hyperlipidemia, Hypertension, Kidney disease, Vision loss, Stroke, Miscarriages / [place], or Mental retardation. Social History:  reports that she has never smoked. She has never used smokeless tobacco. She reports that she does not drink alcohol or use illicit drugs.   Prenatal Transfer Tool  Maternal Diabetes: No Genetic Screening: Normal Maternal Ultrasounds/Referrals: Normal Fetal Ultrasounds or other Referrals:  None Maternal  Substance Abuse:  No Significant Maternal Medications:  None Significant Maternal Lab Results:  None Other Comments:  None  Review of Systems  Constitutional: Negative.   All other systems reviewed and are negative.   Dilation: 6.5 Effacement (%): 80 Station: -2 Exam by:: [employer] RN currently breastfeeding. Maternal Exam:  Uterine Assessment: Contraction strength is mild.  Contraction frequency is regular.   Abdomen: Patient reports no abdominal tenderness. Fetal presentation: vertex  Introitus: Normal vulva. Normal vagina.  Ferning test: negative.  Nitrazine test: negative. Amniotic fluid character: not assessed.  Pelvis: adequate for delivery.   Cervix: Cervix evaluated by digital exam.     Physical Exam  Nursing note and vitals reviewed. Constitutional: She appears well-developed and well-nourished.  Cardiovascular: Normal rate and regular rhythm.   Respiratory: Breath sounds normal.  Genitourinary: Vagina normal and uterus normal.  Musculoskeletal: Normal range of motion.    Prenatal labs: ABO, Rh: A/Positive/-- (09/10 0000) Antibody: Negative (09/10 0000) Rubella: Immune (09/10 0000) RPR: Nonreactive (09/10 0000)  HBsAg: Negative (09/10 0000)  HIV: Non-reactive (09/10 0000)  GBS: Positive (03/09 0000)   Assessment/Plan: Term IUP in early labor Admit GBS prophylaxis   Lucielle Vokes J 08/15/2014, 10:36 PM

## 2014-08-15 NOTE — MAU Note (Signed)
Paged returned by Dr. Ronita Hipps. To give pt. Option of being rechecked in one hour. Discharge if no change. Pt. Updated with POC.

## 2014-08-15 NOTE — MAU Note (Signed)
Pt presents complaining of possible rupture of membranes at 0830 this am. Saw a medium sized gush of clear fluid and some leaking since. Reports good fetal movement. Denies vaginal bleeding.

## 2014-08-16 ENCOUNTER — Encounter (HOSPITAL_COMMUNITY): Payer: Self-pay | Admitting: *Deleted

## 2014-08-16 LAB — CBC
HEMATOCRIT: 32.5 % — AB (ref 36.0–46.0)
HEMOGLOBIN: 11 g/dL — AB (ref 12.0–15.0)
MCH: 30.5 pg (ref 26.0–34.0)
MCHC: 33.8 g/dL (ref 30.0–36.0)
MCV: 90 fL (ref 78.0–100.0)
Platelets: 197 10*3/uL (ref 150–400)
RBC: 3.61 MIL/uL — AB (ref 3.87–5.11)
RDW: 12.7 % (ref 11.5–15.5)
WBC: 13.6 10*3/uL — ABNORMAL HIGH (ref 4.0–10.5)

## 2014-08-16 LAB — TYPE AND SCREEN
ABO/RH(D): A POS
Antibody Screen: NEGATIVE

## 2014-08-16 LAB — RPR: RPR Ser Ql: NONREACTIVE

## 2014-08-16 MED ORDER — BENZOCAINE-MENTHOL 20-0.5 % EX AERO
1.0000 "application " | INHALATION_SPRAY | CUTANEOUS | Status: DC | PRN
  Administered 2014-08-16 – 2014-08-17 (×2): 1 via TOPICAL
  Filled 2014-08-16 (×2): qty 56

## 2014-08-16 MED ORDER — SIMETHICONE 80 MG PO CHEW: 80.0000 mg | CHEWABLE_TABLET | ORAL | Status: DC | PRN

## 2014-08-16 MED ORDER — ONDANSETRON HCL 4 MG/2ML IJ SOLN: 4.0000 mg | INTRAMUSCULAR | Status: DC | PRN

## 2014-08-16 MED ORDER — IBUPROFEN 600 MG PO TABS
600.0000 mg | ORAL_TABLET | Freq: Four times a day (QID) | ORAL | Status: DC
  Administered 2014-08-16 – 2014-08-17 (×5): 600 mg via ORAL
  Filled 2014-08-16 (×5): qty 1

## 2014-08-16 MED ORDER — FAMOTIDINE IN NACL 20-0.9 MG/50ML-% IV SOLN
20.0000 mg | Freq: Two times a day (BID) | INTRAVENOUS | Status: DC
  Administered 2014-08-16: 20 mg via INTRAVENOUS
  Filled 2014-08-16: qty 50

## 2014-08-16 MED ORDER — WITCH HAZEL-GLYCERIN EX PADS: 1.0000 "application " | MEDICATED_PAD | CUTANEOUS | Status: DC | PRN

## 2014-08-16 MED ORDER — OXYCODONE-ACETAMINOPHEN 5-325 MG PO TABS: 1.0000 | ORAL_TABLET | ORAL | Status: DC | PRN

## 2014-08-16 MED ORDER — OXYCODONE-ACETAMINOPHEN 5-325 MG PO TABS: 2.0000 | ORAL_TABLET | ORAL | Status: DC | PRN

## 2014-08-16 MED ORDER — PRENATAL MULTIVITAMIN CH
1.0000 | ORAL_TABLET | Freq: Every day | ORAL | Status: DC
  Administered 2014-08-16 – 2014-08-17 (×2): 1 via ORAL
  Filled 2014-08-16 (×2): qty 1

## 2014-08-16 MED ORDER — DIPHENHYDRAMINE HCL 25 MG PO CAPS: 25.0000 mg | ORAL_CAPSULE | Freq: Four times a day (QID) | ORAL | Status: DC | PRN

## 2014-08-16 MED ORDER — SODIUM CHLORIDE 0.9 % IV SOLN
1.0000 g | Freq: Four times a day (QID) | INTRAVENOUS | Status: DC
  Filled 2014-08-16 (×2): qty 1000

## 2014-08-16 MED ORDER — TETANUS-DIPHTH-ACELL PERTUSSIS 5-2.5-18.5 LF-MCG/0.5 IM SUSP: 0.5000 mL | Freq: Once | INTRAMUSCULAR | Status: DC

## 2014-08-16 MED ORDER — METHYLERGONOVINE MALEATE 0.2 MG PO TABS: 0.2000 mg | ORAL_TABLET | ORAL | Status: DC | PRN

## 2014-08-16 MED ORDER — ZOLPIDEM TARTRATE 5 MG PO TABS: 5.0000 mg | ORAL_TABLET | Freq: Every evening | ORAL | Status: DC | PRN

## 2014-08-16 MED ORDER — ONDANSETRON HCL 4 MG PO TABS: 4.0000 mg | ORAL_TABLET | ORAL | Status: DC | PRN

## 2014-08-16 MED ORDER — AMPICILLIN SODIUM 2 G IJ SOLR
2.0000 g | Freq: Four times a day (QID) | INTRAMUSCULAR | Status: DC
  Administered 2014-08-16: 2 g via INTRAVENOUS
  Filled 2014-08-16 (×2): qty 2000

## 2014-08-16 MED ORDER — METHYLERGONOVINE MALEATE 0.2 MG/ML IJ SOLN: 0.2000 mg | INTRAMUSCULAR | Status: DC | PRN

## 2014-08-16 MED ORDER — LANOLIN HYDROUS EX OINT: TOPICAL_OINTMENT | CUTANEOUS | Status: DC | PRN

## 2014-08-16 MED ORDER — SENNOSIDES-DOCUSATE SODIUM 8.6-50 MG PO TABS
2.0000 | ORAL_TABLET | ORAL | Status: DC
  Administered 2014-08-16: 2 via ORAL
  Filled 2014-08-16: qty 2

## 2014-08-16 MED ORDER — ACETAMINOPHEN 325 MG PO TABS: 650.0000 mg | ORAL_TABLET | ORAL | Status: DC | PRN

## 2014-08-16 MED ORDER — DIBUCAINE 1 % RE OINT: 1.0000 "application " | TOPICAL_OINTMENT | RECTAL | Status: DC | PRN

## 2014-08-16 NOTE — Progress Notes (Signed)
ZEDA GANGWER is a 34 y.o. G3P1010 at [redacted]w[redacted]d by LMP admitted for active labor  Subjective: Occ pressure  Objective: BP 115/79 mmHg  Pulse 111  Temp(Src) 98.7 F (37.1 C) (Oral)  Resp 20  Ht 5\' 7"  (1.702 m)  Wt 78.019 kg (172 lb)  BMI 26.93 kg/m2  SpO2 100%   Total I/O In: -  Out: 150 [Urine:150]  FHT:  FHR: 120 bpm, variability: moderate,  accelerations:  Present,  decelerations:  Absent UC:   irregular, every 2-5 minutes SVE:   Dilation: 10 Effacement (%): 100 Station: 0 Exam by:: Sinda Du, RN  Labs: Lab Results  Component Value Date   WBC 13.6* 08/16/2014   HGB 11.0* 08/16/2014   HCT 32.5* 08/16/2014   MCV 90.0 08/16/2014   PLT 197 08/16/2014    Assessment / Plan: Spontaneous labor, progressing normally  History of operative vaginal delivery- pt wants non intervention  Labor: Progressing normally- will allow passive descent Preeclampsia:  no signs or symptoms of toxicity Fetal Wellbeing:  Category I Pain Control:  Epidural I/D:  n/a Anticipated MOD:  NSVD    [place] Delong J 08/16/2014, 5:29 AM

## 2014-08-16 NOTE — Lactation Note (Signed)
This note was copied from the chart of Maria Berg. Lactation Consultation Note  Patient Name: Maria Berg [id number] Date: 08/16/2014 Reason for consult: Initial assessment;Breast surgery Mom is experienced BF. Still BF her almost 32 year old at bedtime. Reviewed positioning with Mom to obtain good depth with newborn. Encouraged to BF with feeding ques, STS. Lactation brochure left for review, advised of OP services and support group. Encouraged to call for questions/concerns or assist if needed.   Maternal Data Has patient been taught Hand Expression?: Yes Does the patient have breastfeeding experience prior to this delivery?: Yes  Feeding Feeding Type: Breast Fed Length of feed: 5 min  LATCH Score/Interventions Latch: Grasps breast easily, tongue down, lips flanged, rhythmical sucking.  Audible Swallowing: None Intervention(s): Skin to skin  Type of Nipple: Everted at rest and after stimulation  Comfort (Breast/Nipple): Soft / non-tender     Hold (Positioning): Assistance needed to correctly position infant at breast and maintain latch.  LATCH Score: 7  Lactation Tools Discussed/Used WIC Program: No   Consult Status Consult Status: Follow-up Date: 08/17/14 Follow-up type: In-patient    Katrine Coho 08/16/2014, 2:26 PM

## 2014-08-16 NOTE — Progress Notes (Addendum)
Patient ID: Maria Berg, female   DOB: 1981-03-28, 34 y.o.   MRN: [id number] I was called to stand by for delivery for this pt.  Upon entering the room, head was crowned up and she pushed for 2 contractions.  Subsequent delivery of viable female infant. Nuchal cord x 1 reduced.  Shoulders delivered easily.  Infant went straight to mom's chest.  Pt. Asked for delayed cord clamping.  Dr. Ronita Hipps arrived and completed delivery of infant and placenta.

## 2014-08-16 NOTE — Anesthesia Postprocedure Evaluation (Signed)
  Anesthesia Post-op Note  Patient: Maria Berg  Procedure(s) Performed: * No procedures listed *  Patient Location: Mother/Baby  Anesthesia Type:Epidural  Level of Consciousness: awake, alert , oriented and patient cooperative  Airway and Oxygen Therapy: Patient Spontanous Breathing  Post-op Pain: none  Post-op Assessment: Post-op Vital signs reviewed, Patient's Cardiovascular Status Stable, Respiratory Function Stable, Patent Airway, No headache, No backache, No residual numbness and No residual motor weakness  Post-op Vital Signs: Reviewed and stable  Last Vitals:  Filed Vitals:   08/16/14 1115  BP: 123/77  Pulse: 99  Temp: 36.7 C  Resp: 18    Complications: No apparent anesthesia complications

## 2014-08-17 LAB — CBC
HCT: 30 % — ABNORMAL LOW (ref 36.0–46.0)
HEMOGLOBIN: 10 g/dL — AB (ref 12.0–15.0)
MCH: 30.7 pg (ref 26.0–34.0)
MCHC: 33.3 g/dL (ref 30.0–36.0)
MCV: 92 fL (ref 78.0–100.0)
Platelets: 187 10*3/uL (ref 150–400)
RBC: 3.26 MIL/uL — AB (ref 3.87–5.11)
RDW: 13.1 % (ref 11.5–15.5)
WBC: 10.4 10*3/uL (ref 4.0–10.5)

## 2014-08-17 MED ORDER — IBUPROFEN 600 MG PO TABS: 600.0000 mg | ORAL_TABLET | Freq: Four times a day (QID) | ORAL | Status: DC

## 2014-08-17 NOTE — Discharge Instructions (Signed)
Breast Pumping Tips °If you are breastfeeding, there may be times when you cannot feed your baby directly. Returning to work or going on a trip are common examples. Pumping allows you to store breast milk and feed it to your baby later.  °You may not get much milk when you first start to pump. Your breasts should start to make more after a few days. If you pump at the times you usually feed your baby, you may be able to keep making enough milk to feed your baby without also using formula. The more often you pump, the more milk you will produce.  °WHEN SHOULD I PUMP?  °· You can begin to pump soon after delivery. However, some experts recommend waiting about 4 weeks before giving your infant a bottle to make sure breastfeeding is going well.  °· If you plan to return to work, begin pumping a few weeks before. This will help you develop techniques that work best for you. It also lets you build up a supply of breast milk.   °· When you are with your infant, feed on demand and pump after each feeding.   °· When you are away from your infant for several hours, pump for about 15 minutes every 2-3 hours. Pump both breasts at the same time if you can.   °· If your infant has a formula feeding, make sure to pump around the same time.     °· If you drink any alcohol, wait 2 hours before pumping.   °HOW DO I PREPARE TO PUMP? °Your let-down reflex is the natural reaction to stimulation that makes your breast milk flow. It is easier to stimulate this reflex when you are relaxed. Find relaxation techniques that work for you. If you have difficulty with your let-down reflex, try these methods:  °· Smell one of your infant's blankets or an item of clothing.   °· Look at a picture or video of your infant.   °· Sit in a quiet, private space.   °· Massage the breast you plan to pump.   °· Place soothing warmth on the breast.   °· Play relaxing music.   °WHAT ARE SOME GENERAL BREAST PUMPING TIPS? °· Wash your hands before you pump. You  do not need to wash your nipples or breasts. °· There are three ways to pump. °¨ You can use your hand to massage and compress your breast. °¨ You can use a handheld manual pump. °¨ You can use an electric pump.   °· Make sure the suction cup (flange) on the breast pump is the right size. Place the flange directly over the nipple. If it is the wrong size or placed the wrong way, it may be painful and cause nipple damage.   °· If pumping is uncomfortable, apply a small amount of purified or modified lanolin to your nipple and areola. °· If you are using an electric pump, adjust the speed and suction power to be more comfortable. °· If pumping is painful or if you are not getting very much milk, you may need a different type of pump. A lactation consultant can help you determine what type of pump to use.   °· Keep a full water bottle near you at all times. Drinking lots of fluid helps you make more milk.  °· You can store your milk to use later. Pumped breast milk can be stored in a sealable, sterile container or plastic bag. Label all stored breast milk with the date you pumped it. °¨ Milk can stay out at room temperature for up to 8 hours. °¨   You can store your milk in the refrigerator for up to 8 days. °¨ You can store your milk in the freezer for 3 months. Thaw frozen milk using warm water. Do not put it in the microwave. °· Do not smoke. Smoking can lower your milk supply and harm your infant. If you need help quitting, ask your health care provider to recommend a program.   °WHEN SHOULD I CALL MY HEALTH CARE PROVIDER OR A LACTATION CONSULTANT? °· You are having trouble pumping. °· You are concerned that you are not making enough milk. °· You have nipple pain, soreness, or redness. °· You want to use birth control. Birth control pills may lower your milk supply. Talk to your health care provider about your options. °Document Released: 10/11/2009 Document Revised: 04/28/2013 Document Reviewed:  02/13/2013 °[employer]® Patient Information ©2015 [employer]. This information is not intended to replace advice given to you by your health care provider. Make sure you discuss any questions you have with your health care provider. ° °Nutrition for the New Mother  °A new mother needs good health and nutrition so she can have energy to take care of a new baby. Whether a mother breastfeeds or formula feeds the baby, it is important to have a well-balanced diet. Foods from all the food groups should be chosen to meet the new mother's energy needs and to give her the nutrients needed for repair and healing.  °A HEALTHY EATING PLAN °The My Pyramid plan for Moms outlines what you should eat to help you and your baby stay healthy. The energy and amount of food you need depends on whether or not you are breastfeeding. If you are breastfeeding you will need more nutrients. If you choose not to breastfeed, your nutrition goal should be to return to a healthy weight. Limiting calories may be needed if you are not breastfeeding.  °HOME CARE INSTRUCTIONS  °· For a personal plan based on your unique needs, see your Registered Dietitian or visit www.mypyramid.gov. °· Eat a variety of foods. The plan below will help guide you. The following chart has a suggested daily meal plan from the My Pyramid for Moms. °· Eat a variety of fruits and vegetables. °· Eat more dark green and orange vegetables and cooked dried beans. °· Make half your grains whole grains. Choose whole instead of refined grains. °· Choose low-fat or lean meats and poultry. °· Choose low-fat or fat-free dairy products like milk, cheese, or yogurt. °Fruits °· Breastfeeding: 2 cups °· Non-Breastfeeding: 2 cups °· What Counts as a serving? °¨ 1 cup of fruit or juice. °¨ ½ cup dried fruit. °Vegetables °· Breastfeeding: 3 cups °· Non-Breastfeeding: 2 ½ cups °· What Counts as a serving? °¨ 1 cup raw or cooked vegetables. °¨ Juice or 2 cups raw leafy  vegetables. °Grains °· Breastfeeding: 8 oz °· Non-Breastfeeding: 6 oz °· What Counts as a serving? °¨ 1 slice bread. °¨ 1 oz ready-to-eat cereal. °¨ ½ cup cooked pasta, rice, or cereal. °Meat and Beans °· Breastfeeding: 6 ½ oz °· Non-Breastfeeding: 5 ½ oz °· What Counts as a serving? °¨ 1 oz lean meat, poultry, or fish °¨ ¼ cup cooked dry beans °¨ ½ oz nuts or 1 egg °¨ 1 tbs peanut butter °Milk °· Breastfeeding: 3 cups °· Non-Breastfeeding: 3 cups °· What Counts as a serving? °¨ 1 cup milk. °¨ 8 oz yogurt. °¨ 1 ½ oz cheese. °¨ 2 oz processed cheese. °TIPS FOR THE BREASTFEEDING MOM °· Rapid weight   loss is not suggested when you are breastfeeding. By simply breastfeeding, you will be able to lose the weight gained during your pregnancy. Your caregiver can keep track of your weight and tell you if your weight loss is appropriate.  Be sure to drink fluids. You may notice that you are thirstier than usual. A suggestion is to drink a glass of water or other beverage whenever you breastfeed.  Avoid alcohol as it can be passed into your breast milk.  Limit caffeine drinks to no more than 2 to 3 cups per day.  You may need to keep taking your prenatal vitamin while you are breastfeeding. Talk with your caregiver about taking a vitamin or supplement. RETURING TO A HEALTHY WEIGHT  The My Pyramid Plan for Moms will help you return to a healthy weight. It will also provide the nutrients you need.  You may need to limit "empty" calories. These include:  High fat foods like fried foods, fatty meats, fast food, butter, and mayonnaise.  High sugar foods like sodas, jelly, candy, and sweets.  Be physically active. Include 30 minutes of exercise or more each day. Choose an activity you like such as walking, swimming, biking, or aerobics. Check with your caregiver before you start to exercise. Document Released: 07/31/2007 Document Revised: 07/16/2011 Document Reviewed: 07/31/2007 [facility] Patient Information  2015 [place], [place]. This information is not intended to replace advice given to you by your health care provider. Make sure you discuss any questions you have with your health care provider. Postpartum Depression and Baby Blues The postpartum period begins right after the birth of a baby. During this time, there is often a great amount of joy and excitement. It is also a time of many changes in the life of the parents. Regardless of how many times a mother gives birth, each child brings new challenges and dynamics to the family. It is not unusual to have feelings of excitement along with confusing shifts in moods, emotions, and thoughts. All mothers are at risk of developing postpartum depression or the "baby blues." These mood changes can occur right after giving birth, or they may occur many months after giving birth. The baby blues or postpartum depression can be mild or severe. Additionally, postpartum depression can go away rather quickly, or it can be a long-term condition.  CAUSES Raised hormone levels and the rapid drop in those levels are thought to be a main cause of postpartum depression and the baby blues. A number of hormones change during and after pregnancy. Estrogen and progesterone usually decrease right after the delivery of your baby. The levels of thyroid hormone and various cortisol steroids also rapidly drop. Other factors that play a role in these mood changes include major life events and genetics.  RISK FACTORS If you have any of the following risks for the baby blues or postpartum depression, know what symptoms to watch out for during the postpartum period. Risk factors that may increase the likelihood of getting the baby blues or postpartum depression include:  Having a personal or family history of depression.   Having depression while being pregnant.   Having premenstrual mood issues or mood issues related to oral contraceptives.  Having a lot of life stress.   Having  marital conflict.   Lacking a social support network.   Having a baby with special needs.   Having health problems, such as diabetes.  SIGNS AND SYMPTOMS Symptoms of baby blues include:  Brief changes in mood, such as going  from extreme happiness to sadness.  Decreased concentration.   Difficulty sleeping.   Crying spells, tearfulness.   Irritability.   Anxiety.  Symptoms of postpartum depression typically begin within the first month after giving birth. These symptoms include:  Difficulty sleeping or excessive sleepiness.   Marked weight loss.   Agitation.   Feelings of worthlessness.   Lack of interest in activity or food.  Postpartum psychosis is a very serious condition and can be dangerous. Fortunately, it is rare. Displaying any of the following symptoms is cause for immediate medical attention. Symptoms of postpartum psychosis include:   Hallucinations and delusions.   Bizarre or disorganized behavior.   Confusion or disorientation.  DIAGNOSIS  A diagnosis is made by an evaluation of your symptoms. There are no medical or lab tests that lead to a diagnosis, but there are various questionnaires that a health care provider may use to identify those with the baby blues, postpartum depression, or psychosis. Often, a screening tool called the [place] Postnatal Depression Scale is used to diagnose depression in the postpartum period.  TREATMENT The baby blues usually goes away on its own in 1-2 weeks. Social support is often all that is needed. You will be encouraged to get adequate sleep and rest. Occasionally, you may be given medicines to help you sleep.  Postpartum depression requires treatment because it can last several months or longer if it is not treated. Treatment may include individual or group therapy, medicine, or both to address any social, physiological, and psychological factors that may play a role in the depression. Regular exercise, a  healthy diet, rest, and social support may also be strongly recommended.  Postpartum psychosis is more serious and needs treatment right away. Hospitalization is often needed. HOME CARE INSTRUCTIONS  Get as much rest as you can. Nap when the baby sleeps.   Exercise regularly. Some women find yoga and walking to be beneficial.   Eat a balanced and nourishing diet.   Do little things that you enjoy. Have a cup of tea, take a bubble bath, read your favorite magazine, or listen to your favorite music.  Avoid alcohol.   Ask for help with household chores, cooking, grocery shopping, or running errands as needed. Do not try to do everything.   Talk to people close to you about how you are feeling. Get support from your partner, family members, friends, or other new moms.  Try to stay positive in how you think. Think about the things you are grateful for.   Do not spend a lot of time alone.   Only take over-the-counter or prescription medicine as directed by your health care provider.  Keep all your postpartum appointments.   Let your health care provider know if you have any concerns.  SEEK MEDICAL CARE IF: You are having a reaction to or problems with your medicine. SEEK IMMEDIATE MEDICAL CARE IF:  You have suicidal feelings.   You think you may harm the baby or someone else. MAKE SURE YOU:  Understand these instructions.  Will watch your condition.  Will get help right away if you are not doing well or get worse. Document Released: 01/26/2004 Document Revised: 04/28/2013 Document Reviewed: 02/02/2013 [facility] Patient Information 2015 [place], [place]. This information is not intended to replace advice given to you by your health care provider. Make sure you discuss any questions you have with your health care provider. Breastfeeding and Mastitis Mastitis is inflammation of the breast tissue. It can occur in women who  are breastfeeding. This can make breastfeeding  painful. Mastitis will sometimes go away on its own. Your health care provider will help determine if treatment is needed. CAUSES Mastitis is often associated with a blocked milk (lactiferous) duct. This can happen when too much milk builds up in the breast. Causes of excess milk in the breast can include:  Poor latch-on. If your baby is not latched onto the breast properly, she or he may not empty your breast completely while breastfeeding.  Allowing too much time to pass between feedings.  Wearing a bra or other clothing that is too tight. This puts extra pressure on the lactiferous ducts so milk does not flow through them as it should. Mastitis can also be caused by a bacterial infection. Bacteria may enter the breast tissue through cuts or openings in the skin. In women who are breastfeeding, this may occur because of cracked or irritated skin. Cracks in the skin are often caused when your baby does not latch on properly to the breast. SIGNS AND SYMPTOMS  Swelling, redness, tenderness, and pain in an area of the breast.  Swelling of the glands under the arm on the same side.  Fever may or may not accompany mastitis. If an infection is allowed to progress, a collection of pus (abscess) may develop. DIAGNOSIS  Your health care provider can usually diagnose mastitis based on your symptoms and a physical exam. Tests may be done to help confirm the diagnosis. These may include:  Removal of pus from the breast by applying pressure to the area. This pus can be examined in the lab to determine which bacteria are present. If an abscess has developed, the fluid in the abscess can be removed with a needle. This can also be used to confirm the diagnosis and determine the bacteria present. In most cases, pus will not be present.  Blood tests to determine if your body is fighting a bacterial infection.  Mammogram or ultrasound tests to rule out other problems or diseases. TREATMENT  Mastitis that  occurs with breastfeeding will sometimes go away on its own. Your health care provider may choose to wait 24 hours after first seeing you to decide whether a prescription medicine is needed. If your symptoms are worse after 24 hours, your health care provider will likely prescribe an antibiotic medicine to treat the mastitis. He or she will determine which bacteria are most likely causing the infection and will then select an appropriate antibiotic medicine. This is sometimes changed based on the results of tests performed to identify the bacteria, or if there is no response to the antibiotic medicine selected. Antibiotic medicines are usually given by mouth. You may also be given medicine for pain. HOME CARE INSTRUCTIONS  Only take over-the-counter or prescription medicines for pain, fever, or discomfort as directed by your health care provider.  If your health care provider prescribed an antibiotic medicine, take the medicine as directed. Make sure you finish it even if you start to feel better.  Do not wear a tight or underwire bra. Wear a soft, supportive bra.  Increase your fluid intake, especially if you have a fever.  Continue to empty the breast. Your health care provider can tell you whether this milk is safe for your infant or needs to be thrown out. You may be told to stop nursing until your health care provider thinks it is safe for your baby. Use a breast pump if you are advised to stop nursing.  Keep your nipples  clean and dry.  Empty the first breast completely before going to the other breast. If your baby is not emptying your breasts completely for some reason, use a breast pump to empty your breasts.  If you go back to work, pump your breasts while at work to stay in time with your nursing schedule.  Avoid allowing your breasts to become overly filled with milk (engorged). SEEK MEDICAL CARE IF:  You have pus-like discharge from the breast.  Your symptoms do not improve with  the treatment prescribed by your health care provider within 2 days. SEEK IMMEDIATE MEDICAL CARE IF:  Your pain and swelling are getting worse.  You have pain that is not controlled with medicine.  You have a red line extending from the breast toward your armpit.  You have a fever or persistent symptoms for more than 2-3 days.  You have a fever and your symptoms suddenly get worse. MAKE SURE YOU:   Understand these instructions.  Will watch your condition.  Will get help right away if you are not doing well or get worse. Document Released: 08/18/2004 Document Revised: 04/28/2013 Document Reviewed: 11/27/2012 [facility] Patient Information 2015 [place], [place]. This information is not intended to replace advice given to you by your health care provider. Make sure you discuss any questions you have with your health care provider. Breastfeeding Deciding to breastfeed is one of the best choices you can make for you and your baby. A change in hormones during pregnancy causes your breast tissue to grow and increases the number and size of your milk ducts. These hormones also allow proteins, sugars, and fats from your blood supply to make breast milk in your milk-producing glands. Hormones prevent breast milk from being released before your baby is born as well as prompt milk flow after birth. Once breastfeeding has begun, thoughts of your baby, as well as his or her sucking or crying, can stimulate the release of milk from your milk-producing glands.  BENEFITS OF BREASTFEEDING For Your Baby  Your first milk (colostrum) helps your baby's digestive system function better.   There are antibodies in your milk that help your baby fight off infections.   Your baby has a lower incidence of asthma, allergies, and sudden infant death syndrome.   The nutrients in breast milk are better for your baby than infant formulas and are designed uniquely for your baby's needs.   Breast milk improves your  baby's brain development.   Your baby is less likely to develop other conditions, such as childhood obesity, asthma, or type 2 diabetes mellitus.  For You   Breastfeeding helps to create a very special bond between you and your baby.   Breastfeeding is convenient. Breast milk is always available at the correct temperature and costs nothing.   Breastfeeding helps to burn calories and helps you lose the weight gained during pregnancy.   Breastfeeding makes your uterus contract to its prepregnancy size faster and slows bleeding (lochia) after you give birth.   Breastfeeding helps to lower your risk of developing type 2 diabetes mellitus, osteoporosis, and breast or ovarian cancer later in life. SIGNS THAT YOUR BABY IS HUNGRY Early Signs of Hunger  Increased alertness or activity.  Stretching.  Movement of the head from side to side.  Movement of the head and opening of the mouth when the corner of the mouth or cheek is stroked (rooting).  Increased sucking sounds, smacking lips, cooing, sighing, or squeaking.  Hand-to-mouth movements.  Increased sucking of  fingers or hands. Late Signs of Hunger  Fussing.  Intermittent crying. Extreme Signs of Hunger Signs of extreme hunger will require calming and consoling before your baby will be able to breastfeed successfully. Do not wait for the following signs of extreme hunger to occur before you initiate breastfeeding:   Restlessness.  A loud, strong cry.   Screaming. BREASTFEEDING BASICS Breastfeeding Initiation  Find a comfortable place to sit or lie down, with your neck and back well supported.  Place a pillow or rolled up blanket under your baby to bring him or her to the level of your breast (if you are seated). Nursing pillows are specially designed to help support your arms and your baby while you breastfeed.  Make sure that your baby's abdomen is facing your abdomen.   Gently massage your breast. With your  fingertips, massage from your chest wall toward your nipple in a circular motion. This encourages milk flow. You may need to continue this action during the feeding if your milk flows slowly.  Support your breast with 4 fingers underneath and your thumb above your nipple. Make sure your fingers are well away from your nipple and your baby's mouth.   Stroke your baby's lips gently with your finger or nipple.   When your baby's mouth is open wide enough, quickly bring your baby to your breast, placing your entire nipple and as much of the colored area around your nipple (areola) as possible into your baby's mouth.   More areola should be visible above your baby's upper lip than below the lower lip.   Your baby's tongue should be between his or her lower gum and your breast.   Ensure that your baby's mouth is correctly positioned around your nipple (latched). Your baby's lips should create a seal on your breast and be turned out (everted).  It is common for your baby to suck about 2-3 minutes in order to start the flow of breast milk. Latching Teaching your baby how to latch on to your breast properly is very important. An improper latch can cause nipple pain and decreased milk supply for you and poor weight gain in your baby. Also, if your baby is not latched onto your nipple properly, he or she may swallow some air during feeding. This can make your baby fussy. Burping your baby when you switch breasts during the feeding can help to get rid of the air. However, teaching your baby to latch on properly is still the best way to prevent fussiness from swallowing air while breastfeeding. Signs that your baby has successfully latched on to your nipple:    Silent tugging or silent sucking, without causing you pain.   Swallowing heard between every 3-4 sucks.    Muscle movement above and in front of his or her ears while sucking.  Signs that your baby has not successfully latched on to  nipple:   Sucking sounds or smacking sounds from your baby while breastfeeding.  Nipple pain. If you think your baby has not latched on correctly, slip your finger into the corner of your baby's mouth to break the suction and place it between your baby's gums. Attempt breastfeeding initiation again. Signs of Successful Breastfeeding Signs from your baby:   A gradual decrease in the number of sucks or complete cessation of sucking.   Falling asleep.   Relaxation of his or her body.   Retention of a small amount of milk in his or her mouth.   Letting go  of your breast by himself or herself. Signs from you:  Breasts that have increased in firmness, weight, and size 1-3 hours after feeding.   Breasts that are softer immediately after breastfeeding.  Increased milk volume, as well as a change in milk consistency and color by the fifth day of breastfeeding.   Nipples that are not sore, cracked, or bleeding. Signs That Your [employer] is Getting Enough Milk  Wetting at least 3 diapers in a 24-hour period. The urine should be clear and pale yellow by age 68 days.  At least 3 stools in a 24-hour period by age 68 days. The stool should be soft and yellow.  At least 3 stools in a 24-hour period by age 80 days. The stool should be seedy and yellow.  No loss of weight greater than 10% of birth weight during the first 52 days of age.  Average weight gain of 4-7 ounces (113-198 g) per week after age 74 days.  Consistent daily weight gain by age 740 days, without weight loss after the age of 2 weeks. After a feeding, your baby may spit up a small amount. This is common. BREASTFEEDING FREQUENCY AND DURATION Frequent feeding will help you make more milk and can prevent sore nipples and breast engorgement. Breastfeed when you feel the need to reduce the fullness of your breasts or when your baby shows signs of hunger. This is called "breastfeeding on demand." Avoid introducing a pacifier to your  baby while you are working to establish breastfeeding (the first 4-6 weeks after your baby is born). After this time you may choose to use a pacifier. Research has shown that pacifier use during the first year of a baby's life decreases the risk of sudden infant death syndrome (SIDS). Allow your baby to feed on each breast as long as he or she wants. Breastfeed until your baby is finished feeding. When your baby unlatches or falls asleep while feeding from the first breast, offer the second breast. Because newborns are often sleepy in the first few weeks of life, you may need to awaken your baby to get him or her to feed. Breastfeeding times will vary from baby to baby. However, the following rules can serve as a guide to help you ensure that your baby is properly fed:  Newborns (babies 63 weeks of age or younger) may breastfeed every 1-3 hours.  Newborns should not go longer than 3 hours during the day or 5 hours during the night without breastfeeding.  You should breastfeed your baby a minimum of 8 times in a 24-hour period until you begin to introduce solid foods to your baby at around 43 months of age. BREAST MILK PUMPING Pumping and storing breast milk allows you to ensure that your baby is exclusively fed your breast milk, even at times when you are unable to breastfeed. This is especially important if you are going back to work while you are still breastfeeding or when you are not able to be present during feedings. Your lactation consultant can give you guidelines on how long it is safe to store breast milk.  A breast pump is a machine that allows you to pump milk from your breast into a sterile bottle. The pumped breast milk can then be stored in a refrigerator or freezer. Some breast pumps are operated by hand, while others use electricity. Ask your lactation consultant which type will work best for you. Breast pumps can be purchased, but some hospitals and breastfeeding support groups  lease  breast pumps on a monthly basis. A lactation consultant can teach you how to hand express breast milk, if you prefer not to use a pump.  CARING FOR YOUR BREASTS WHILE YOU BREASTFEED Nipples can become dry, cracked, and sore while breastfeeding. The following recommendations can help keep your breasts moisturized and healthy:  Avoid using soap on your nipples.   Wear a supportive bra. Although not required, special nursing bras and tank tops are designed to allow access to your breasts for breastfeeding without taking off your entire bra or top. Avoid wearing underwire-style bras or extremely tight bras.  Air dry your nipples for 3-70minutes after each feeding.   Use only cotton bra pads to absorb leaked breast milk. Leaking of breast milk between feedings is normal.   Use lanolin on your nipples after breastfeeding. Lanolin helps to maintain your skin's normal moisture barrier. If you use pure lanolin, you do not need to wash it off before feeding your baby again. Pure lanolin is not toxic to your baby. You may also hand express a few drops of breast milk and gently massage that milk into your nipples and allow the milk to air dry. In the first few weeks after giving birth, some women experience extremely full breasts (engorgement). Engorgement can make your breasts feel heavy, warm, and tender to the touch. Engorgement peaks within 3-5 days after you give birth. The following recommendations can help ease engorgement:  Completely empty your breasts while breastfeeding or pumping. You may want to start by applying warm, moist heat (in the shower or with warm water-soaked hand towels) just before feeding or pumping. This increases circulation and helps the milk flow. If your baby does not completely empty your breasts while breastfeeding, pump any extra milk after he or she is finished.  Wear a snug bra (nursing or regular) or tank top for 1-2 days to signal your body to slightly decrease milk  production.  Apply ice packs to your breasts, unless this is too uncomfortable for you.  Make sure that your baby is latched on and positioned properly while breastfeeding. If engorgement persists after 48 hours of following these recommendations, contact your health care provider or a Science writer. OVERALL HEALTH CARE RECOMMENDATIONS WHILE BREASTFEEDING  Eat healthy foods. Alternate between meals and snacks, eating 3 of each per day. Because what you eat affects your breast milk, some of the foods may make your baby more irritable than usual. Avoid eating these foods if you are sure that they are negatively affecting your baby.  Drink milk, fruit juice, and water to satisfy your thirst (about 10 glasses a day).   Rest often, relax, and continue to take your prenatal vitamins to prevent fatigue, stress, and anemia.  Continue breast self-awareness checks.  Avoid chewing and smoking tobacco.  Avoid alcohol and drug use. Some medicines that may be harmful to your baby can pass through breast milk. It is important to ask your health care provider before taking any medicine, including all over-the-counter and prescription medicine as well as vitamin and herbal supplements. It is possible to become pregnant while breastfeeding. If birth control is desired, ask your health care provider about options that will be safe for your baby. SEEK MEDICAL CARE IF:   You feel like you want to stop breastfeeding or have become frustrated with breastfeeding.  You have painful breasts or nipples.  Your nipples are cracked or bleeding.  Your breasts are red, tender, or warm.  You have  a swollen area on either breast.  You have a fever or chills.  You have nausea or vomiting.  You have drainage other than breast milk from your nipples.  Your breasts do not become full before feedings by the fifth day after you give birth.  You feel sad and depressed.  Your baby is too sleepy to eat  well.  Your baby is having trouble sleeping.   Your baby is wetting less than 3 diapers in a 24-hour period.  Your baby has less than 3 stools in a 24-hour period.  Your baby's skin or the white part of his or her eyes becomes yellow.   Your baby is not gaining weight by 41 days of age. SEEK IMMEDIATE MEDICAL CARE IF:   Your baby is overly tired (lethargic) and does not want to wake up and feed.  Your baby develops an unexplained fever. Document Released: 04/23/2005 Document Revised: 04/28/2013 Document Reviewed: 10/15/2012 [facility] Patient Information 2015 [place], [place]. This information is not intended to replace advice given to you by your health care provider. Make sure you discuss any questions you have with your health care provider.

## 2014-08-17 NOTE — Progress Notes (Signed)
Patient ID: Maretta Los, female   DOB: 05/17/1980, 34 y.o.   MRN: [id number] PPD # 1 SVD  S:  Reports feeling well, requesting early discharge             Tolerating po/ No nausea or vomiting             Bleeding is light             Pain controlled with ibuprofen (OTC)             Up ad lib / ambulatory / voiding without difficulties    Newborn  Information for the patient's newborn:  Klea, Nall [[id number]]  female  breast feeding  / Circumcision done   O:  A & O x 3, in no apparent distress              VS:  Filed Vitals:   08/16/14 1115 08/16/14 1515 08/16/14 2315 08/17/14 0607  BP: 123/77 118/82 109/64 116/88  Pulse: 99 81 79 76  Temp: 98 F (36.7 C) 97.7 F (36.5 C) 98 F (36.7 C) 97.3 F (36.3 C)  TempSrc: Oral Oral Oral Axillary  Resp: 18 20 20 20   Height:      Weight:      SpO2:   99% 99%    LABS:  Recent Labs  08/16/14 0035 08/17/14 0613  WBC 13.6* 10.4  HGB 11.0* 10.0*  HCT 32.5* 30.0*  PLT 197 187    Blood type: --/--/A POS (04/11 0035)  Rubella: Immune (09/10 0000)   I&O: I/O last 3 completed shifts: In: -  Out: 1900 [Urine:1700; Blood:200]             Lungs: Clear and unlabored  Heart: regular rate and rhythm / no murmurs  Abdomen: soft, non-tender, non-distended              Fundus: firm, non-tender, U-even  Perineum: 2nd degree repair healing well  Lochia: minimal  Extremities: no edema, no calf pain or tenderness, no Homans    A/P: PPD # 1  34 y.o., [id number]   Principal Problem:   Postpartum care following vaginal delivery (4/11) Active Problems:   Indication for care in labor or delivery   Doing well - stable status  Routine post partum orders  Early discharge home today    Renato Battles, Gregor Dershem, M, MSN, CNM 08/17/2014, 9:38 AM

## 2014-08-17 NOTE — Lactation Note (Signed)
This note was copied from the chart of [place]. Lactation Consultation Note  Follow up visit made prior to discharge. Mom states baby is nursing well and she hears swallows.  She is currently breastfeeding her younger child at nap times.  Denies questions or concerns at present.  Encouraged outpatient services prn.  Patient Name: Maria Berg DGLOV'F Date: 08/17/2014     Maternal Data    Feeding Feeding Type: Breast Fed Length of feed: 20 min  LATCH Score/Interventions Latch: Grasps breast easily, tongue down, lips flanged, rhythmical sucking.  Audible Swallowing: A few with stimulation  Type of Nipple: Everted at rest and after stimulation  Comfort (Breast/Nipple): Soft / non-tender     Hold (Positioning): No assistance needed to correctly position infant at breast.  LATCH Score: 9  Lactation Tools Discussed/Used     Consult Status      [name] 08/17/2014, 11:42 AM

## 2014-08-17 NOTE — Discharge Summary (Signed)
Obstetric Discharge Summary Reason for Admission: onset of labor Prenatal Procedures: none Intrapartum Procedures: spontaneous vaginal delivery Postpartum Procedures: none Complications-Operative Maria Postpartum: 2nd degree perineal laceration HEMOGLOBIN  Date Value Ref Range Status  08/17/2014 10.0* 12.0 - 15.0 g/dL Final   HCT  Date Value Ref Range Status  08/17/2014 30.0* 36.0 - 46.0 % Final    Physical Exam:  General: alert, cooperative Maria no distress Lochia: appropriate Uterine Fundus: firm, midline, U-even DVT Evaluation: No evidence of DVT seen on physical exam. Negative Homan's sign. No cords or calf tenderness. No significant calf/ankle edema.  Discharge Diagnoses: Term Pregnancy-delivered  Discharge Information: Date: 08/17/2014 Activity: pelvic rest Diet: routine Medications: PNV Maria Ibuprofen Condition: stable Instructions: refer to practice specific booklet Discharge to: home Follow-up Information    Follow up with Maria Berg,Maria R, MD. Schedule an appointment as soon as possible for a visit in 6 weeks.   Specialty:  Obstetrics Maria Gynecology   Why:  postpartum visit   Contact information:   [place] [address] [phone]       Newborn Data: Live born female on 08/16/2014 Birth Weight: 7 lb 13.6 oz (3560 g) APGAR: 9, 9  Home with mother.  Laury Berg, M MSN, CNM 08/17/2014, 10:07 AM

## 2015-02-14 ENCOUNTER — Ambulatory Visit: Payer: Self-pay | Admitting: Pediatrics

## 2015-03-25 ENCOUNTER — Emergency Department (HOSPITAL_COMMUNITY)
Admission: EM | Admit: 2015-03-25 | Discharge: 2015-03-25 | Disposition: A | Discharge status: Home / Self Care | Payer: 59 | Attending: Emergency Medicine | Admitting: Emergency Medicine

## 2015-03-25 ENCOUNTER — Encounter (HOSPITAL_COMMUNITY): Payer: Self-pay | Admitting: *Deleted

## 2015-03-25 ENCOUNTER — Emergency Department (HOSPITAL_COMMUNITY): Payer: 59

## 2015-03-25 DIAGNOSIS — R000 Tachycardia, unspecified: Secondary | ICD-10-CM | POA: Diagnosis not present

## 2015-03-25 DIAGNOSIS — Z79899 Other long term (current) drug therapy: Secondary | ICD-10-CM | POA: Insufficient documentation

## 2015-03-25 DIAGNOSIS — R1010 Upper abdominal pain, unspecified: Secondary | ICD-10-CM | POA: Diagnosis present

## 2015-03-25 DIAGNOSIS — K8020 Calculus of gallbladder without cholecystitis without obstruction: Secondary | ICD-10-CM

## 2015-03-25 DIAGNOSIS — Z8619 Personal history of other infectious and parasitic diseases: Secondary | ICD-10-CM | POA: Insufficient documentation

## 2015-03-25 LAB — COMPREHENSIVE METABOLIC PANEL
ALK PHOS: 117 U/L (ref 38–126)
ALT: 53 U/L (ref 14–54)
AST: 107 U/L — AB (ref 15–41)
Albumin: 4.4 g/dL (ref 3.5–5.0)
Anion gap: 13 (ref 5–15)
BILIRUBIN TOTAL: 0.5 mg/dL (ref 0.3–1.2)
BUN: 21 mg/dL — ABNORMAL HIGH (ref 6–20)
CALCIUM: 9.5 mg/dL (ref 8.9–10.3)
CO2: 20 mmol/L — ABNORMAL LOW (ref 22–32)
CREATININE: 0.99 mg/dL (ref 0.44–1.00)
Chloride: 105 mmol/L (ref 101–111)
Glucose, Bld: 119 mg/dL — ABNORMAL HIGH (ref 65–99)
Potassium: 3.4 mmol/L — ABNORMAL LOW (ref 3.5–5.1)
Sodium: 138 mmol/L (ref 135–145)
Total Protein: 7.7 g/dL (ref 6.5–8.1)

## 2015-03-25 LAB — CBC
HCT: 42.3 % (ref 36.0–46.0)
Hemoglobin: 14.1 g/dL (ref 12.0–15.0)
MCH: 31.1 pg (ref 26.0–34.0)
MCHC: 33.3 g/dL (ref 30.0–36.0)
MCV: 93.4 fL (ref 78.0–100.0)
PLATELETS: 252 10*3/uL (ref 150–400)
RBC: 4.53 MIL/uL (ref 3.87–5.11)
RDW: 12.2 % (ref 11.5–15.5)
WBC: 7.4 10*3/uL (ref 4.0–10.5)

## 2015-03-25 LAB — URINALYSIS, ROUTINE W REFLEX MICROSCOPIC
Bilirubin Urine: NEGATIVE
Glucose, UA: NEGATIVE mg/dL
Ketones, ur: NEGATIVE mg/dL
LEUKOCYTES UA: NEGATIVE
Nitrite: NEGATIVE
PROTEIN: NEGATIVE mg/dL
Specific Gravity, Urine: 1.012 (ref 1.005–1.030)
pH: 6.5 (ref 5.0–8.0)

## 2015-03-25 LAB — I-STAT BETA HCG BLOOD, ED (MC, WL, AP ONLY)

## 2015-03-25 LAB — LIPASE, BLOOD: Lipase: 44 U/L (ref 11–51)

## 2015-03-25 LAB — URINE MICROSCOPIC-ADD ON

## 2015-03-25 MED ORDER — FENTANYL CITRATE (PF) 100 MCG/2ML IJ SOLN
50.0000 ug | Freq: Once | INTRAMUSCULAR | Status: AC
  Administered 2015-03-25: 50 ug via NASAL

## 2015-03-25 MED ORDER — ONDANSETRON 4 MG PO TBDP
4.0000 mg | ORAL_TABLET | Freq: Once | ORAL | Status: AC | PRN
  Administered 2015-03-25: 4 mg via ORAL

## 2015-03-25 MED ORDER — ONDANSETRON 4 MG PO TBDP
ORAL_TABLET | ORAL | Status: DC
Start: 2015-03-25 — End: 2015-03-26
  Filled 2015-03-25: qty 1

## 2015-03-25 MED ORDER — FENTANYL CITRATE (PF) 100 MCG/2ML IJ SOLN: 50.0000 ug | Freq: Once | INTRAMUSCULAR | Status: DC

## 2015-03-25 MED ORDER — SODIUM CHLORIDE 0.9 % IV BOLUS (SEPSIS)
1000.0000 mL | Freq: Once | INTRAVENOUS | Status: AC
  Administered 2015-03-25: 1000 mL via INTRAVENOUS

## 2015-03-25 MED ORDER — PROMETHAZINE HCL 25 MG PO TABS
25.0000 mg | ORAL_TABLET | Freq: Once | ORAL | Status: AC
  Administered 2015-03-25: 25 mg via ORAL
  Filled 2015-03-25: qty 1

## 2015-03-25 MED ORDER — OXYCODONE-ACETAMINOPHEN 5-325 MG PO TABS: 1.0000 | ORAL_TABLET | ORAL | Status: DC | PRN

## 2015-03-25 MED ORDER — FENTANYL CITRATE (PF) 100 MCG/2ML IJ SOLN
INTRAMUSCULAR | Status: AC
  Filled 2015-03-25: qty 2

## 2015-03-25 MED ORDER — HYDROMORPHONE HCL 1 MG/ML IJ SOLN
1.0000 mg | Freq: Once | INTRAMUSCULAR | Status: AC
  Administered 2015-03-25: 1 mg via INTRAVENOUS
  Filled 2015-03-25: qty 1

## 2015-03-25 NOTE — Discharge Instructions (Signed)
If you develop worsening or uncontrolled abdominal pain or if you have vomiting, fevers, or your pain will go away come back to the emergency department immediately for reevaluation. Otherwise call the surgeon above for follow-up for an outpatient evaluation of your gallbladder.

## 2015-03-25 NOTE — ED Provider Notes (Signed)
CSN: [id number]     Arrival date & time 03/25/15  1637 History   First MD Initiated Contact with Patient 03/25/15 1758     Chief Complaint  Patient presents with  . Abdominal Pain     (Consider location/radiation/quality/duration/timing/severity/associated sxs/prior Treatment) HPI  34 year old female who is 7 months postpartum presents with acute upper abdominal pain. Started about 3 hours ago. Patient states it has been severe, occasionally waxing and waning. It is sharp. Felt nauseated but has not vomited. Feels like she can't get a deep breath but it does not hurt to breathe and there is no shortness of breath. Denies any chest pain. No fevers. Denies any urinary symptoms. She is currently exclusively breast-feeding.  Past Medical History  Diagnosis Date  . Headache(784.0)   . Pregnancy induced hypertension   . Hx of varicella   . Vacuum extraction, delivered, current hospitalization 10/24/2012  . Postpartum care following vaginal delivery (10/23/12) 10/24/2012   Past Surgical History  Procedure Laterality Date  . Wisdom tooth extraction    . Breast surgery  2005    aug  . Dilation and curettage of uterus      16 wk. w/triploidy   Family History  Problem Relation Age of Onset  . Arthritis Mother   . Asthma Father   . Cancer Father     multiple myeloma  . Depression Father   . Depression Sister   . Learning disabilities Maternal Uncle     autism  . Alcohol abuse Paternal Uncle   . Drug abuse Paternal Uncle   . Arthritis Maternal Grandmother   . Diabetes Maternal Grandfather   . Alcohol abuse Paternal Grandmother   . Cancer Paternal Grandmother   . Mental illness Paternal Grandmother   . Depression Paternal Grandmother   . Alcohol abuse Paternal Grandfather   . Asthma Paternal Grandfather   . Diabetes Paternal Grandfather   . Hearing loss Neg Hx   . Early death Neg Hx   . Heart disease Neg Hx   . Hyperlipidemia Neg Hx   . Hypertension Neg Hx   . Kidney disease  Neg Hx   . Vision loss Neg Hx   . Stroke Neg Hx   . Miscarriages / Stillbirths Neg Hx   . Mental retardation Neg Hx    Social History  Substance Use Topics  . Smoking status: Never Smoker   . Smokeless tobacco: Never Used  . Alcohol Use: No   OB History    Gravida Para Term Preterm AB TAB SAB Ectopic Multiple Living   '3 2 2 ' 0 1 1 0 0 0 1     Review of Systems  Constitutional: Negative for fever.  Respiratory: Negative for shortness of breath.   Cardiovascular: Negative for chest pain.  Gastrointestinal: Positive for nausea and abdominal pain. Negative for vomiting.  Genitourinary: Negative for dysuria.  All other systems reviewed and are negative.     Allergies  Sulfa antibiotics  Home Medications   Prior to Admission medications   Medication Sig Start Date End Date Taking? Authorizing Provider  cetirizine (ZYRTEC) 10 MG tablet Take 10 mg by mouth daily.    Historical Provider, MD  ibuprofen (ADVIL,MOTRIN) 600 MG tablet Take 1 tablet (600 mg total) by mouth every 6 (six) hours. 08/17/14   Laury Deep, CNM  Magnesium 250 MG TABS Take 2 tablets by mouth daily.    Historical Provider, MD  oxyCODONE-acetaminophen (PERCOCET/ROXICET) 5-325 MG per tablet Take 1 tablet by mouth every 4 (  four) hours as needed (for migraine).     Historical Provider, MD  Prenatal Vit-Fe Fumarate-FA (PRENATAL MULTIVITAMIN) TABS Take 1 tablet by mouth daily.     Historical Provider, MD  promethazine (PHENERGAN) 25 MG tablet Take 25 mg by mouth every 6 (six) hours as needed for nausea.     Historical Provider, MD  ranitidine (ZANTAC) 150 MG tablet Take 150 mg by mouth 2 (two) times daily.    Historical Provider, MD  SUMAtriptan (IMITREX) 50 MG tablet Take 50 mg by mouth 2 (two) times daily as needed for migraine. May repeat in 2 hours if migraine persist.    Historical Provider, MD   BP 150/98 mmHg  Pulse 114  Temp(Src) 97.6 F (36.4 C) (Oral)  Resp 14  SpO2 100% Physical Exam  Constitutional:  She is oriented to person, place, and time. She appears well-developed and well-nourished.  HENT:  Head: Normocephalic and atraumatic.  Right Ear: External ear normal.  Left Ear: External ear normal.  Nose: Nose normal.  Eyes: Right eye exhibits no discharge. Left eye exhibits no discharge.  Cardiovascular: Regular rhythm and normal heart sounds.  Tachycardia present.   Pulmonary/Chest: Effort normal and breath sounds normal.  Abdominal: Soft. There is tenderness in the right upper quadrant and epigastric area.  Neurological: She is alert and oriented to person, place, and time.  Skin: Skin is warm and dry.  Nursing note and vitals reviewed.   ED Course  Procedures (including critical care time) Labs Review Labs Reviewed  COMPREHENSIVE METABOLIC PANEL - Abnormal; Notable for the following:    Potassium 3.4 (*)    CO2 20 (*)    Glucose, Bld 119 (*)    BUN 21 (*)    AST 107 (*)    All other components within normal limits  URINALYSIS, ROUTINE W REFLEX MICROSCOPIC (NOT AT [facility]) - Abnormal; Notable for the following:    Hgb urine dipstick TRACE (*)    All other components within normal limits  URINE MICROSCOPIC-ADD ON - Abnormal; Notable for the following:    Squamous Epithelial / LPF 0-5 (*)    Bacteria, UA RARE (*)    All other components within normal limits  LIPASE, BLOOD  CBC  I-STAT BETA HCG BLOOD, ED ([facility], [facility], [facility] ONLY)    Imaging Review US Abdomen Complete  03/25/2015  CLINICAL DATA:  Patient with upper abdominal pain. EXAM: ULTRASOUND ABDOMEN COMPLETE COMPARISON:  None. FINDINGS: Gallbladder: Multiple mobile echogenic gallstones are demonstrated within the gallbladder lumen. No gallbladder wall thickening or pericholecystic fluid. Negative sonographic Murphy's sign. Common bile duct: Diameter: 2.5 mm Liver: No focal lesion identified. Within normal limits in parenchymal echogenicity. IVC: No abnormality visualized. Pancreas: Visualized portion unremarkable. Spleen: Size  and appearance within normal limits. Right Kidney: Length: 10.2 cm. Echogenicity within normal limits. No mass or hydronephrosis visualized. Left Kidney: Length: 11.2 cm. Echogenicity within normal limits. No mass or hydronephrosis visualized. Abdominal aorta: No aneurysm visualized. Other findings: None. IMPRESSION: Cholelithiasis without sonographic evidence for acute cholecystitis. Electronically Signed   By: Lovey Newcomer M.D.   On: 03/25/2015 19:27   I have personally reviewed and evaluated these images and lab results as part of my medical decision-making.   EKG Interpretation   Date/Time:  Friday March 25 2015 16:56:45 EST Ventricular Rate:  112 PR Interval:  164 QRS Duration: 78 QT Interval:  348 QTC Calculation: 475 R Axis:   49 Text Interpretation:  Sinus tachycardia Possible Left atrial enlargement  Borderline ECG  No old tracing to compare Confirmed by [place]  MD, [place]  [phone]) on 03/25/2015 5:55:03 PM      MDM   Final diagnoses:  Calculus of gallbladder without cholecystitis without obstruction    Patient's symptoms are c/w symptomatic cholelithiasis. Pain is much improved. With a normal white blood cell count, no fever, and no Murphy sign I highly doubt cholecystitis. I did repeat her exam and she does have right upper quadrant tenderness but no Murphy sign. She feels much better. She wants to go home and follow-up with a surgeon as an outpatient. I discussed strict return precautions such as vomiting, uncontrolled or worsening pain, or fevers. Discussed that she should probably try to dump her breast milk while she is on narcotics. After discharge she vomited in the waiting room. I discussed that I can bring her back and reevaluate impossibly consult a surgeon but she still wants to go home. Discussed needing to come back if any symptoms worsen.    Sherwood Gambler, MD 03/26/15 [phone]

## 2015-03-25 NOTE — ED Notes (Signed)
Pt taken to lobby and vomited. Called Dr. Regenia Skeeter and made aware pt had been discharged but was in lobby vomiting. Explained we could bring her back and control N/V and call general surgeon but pt wanted to go home and try her phenergan suppository.

## 2015-03-25 NOTE — ED Notes (Signed)
Pt states nausea yesterday and then excruciating epigastric pain today.  Describes pain as "labor in my stomach".

## 2015-03-25 NOTE — ED Notes (Signed)
Dr. Goldston back at the bedside.  

## 2015-04-22 ENCOUNTER — Other Ambulatory Visit: Payer: Self-pay | Admitting: General Surgery

## 2015-05-12 ENCOUNTER — Encounter (HOSPITAL_COMMUNITY): Payer: Self-pay | Admitting: *Deleted

## 2015-05-12 NOTE — Progress Notes (Signed)
Abbreviation on consent  For surgery on 05/16/2015.  Please clarify.  Thank you very much.

## 2015-05-14 ENCOUNTER — Other Ambulatory Visit: Payer: Self-pay | Admitting: General Surgery

## 2015-05-16 ENCOUNTER — Encounter (HOSPITAL_COMMUNITY): Payer: Self-pay | Admitting: *Deleted

## 2015-05-16 ENCOUNTER — Encounter (HOSPITAL_COMMUNITY)
Admission: RE | Disposition: A | Discharge status: Home / Self Care | Payer: Self-pay | Source: Ambulatory Visit | Attending: General Surgery

## 2015-05-16 ENCOUNTER — Ambulatory Visit (HOSPITAL_COMMUNITY): Payer: BLUE CROSS/BLUE SHIELD | Admitting: Anesthesiology

## 2015-05-16 ENCOUNTER — Ambulatory Visit (HOSPITAL_COMMUNITY)
Admission: RE | Admit: 2015-05-16 | Discharge: 2015-05-16 | Disposition: A | Discharge status: Home / Self Care | Payer: BLUE CROSS/BLUE SHIELD | Source: Ambulatory Visit | Attending: General Surgery | Admitting: General Surgery

## 2015-05-16 ENCOUNTER — Ambulatory Visit (HOSPITAL_COMMUNITY): Payer: BLUE CROSS/BLUE SHIELD

## 2015-05-16 DIAGNOSIS — K219 Gastro-esophageal reflux disease without esophagitis: Secondary | ICD-10-CM | POA: Diagnosis present

## 2015-05-16 DIAGNOSIS — K643 Fourth degree hemorrhoids: Secondary | ICD-10-CM | POA: Diagnosis not present

## 2015-05-16 DIAGNOSIS — Z79891 Long term (current) use of opiate analgesic: Secondary | ICD-10-CM | POA: Diagnosis not present

## 2015-05-16 DIAGNOSIS — Z79899 Other long term (current) drug therapy: Secondary | ICD-10-CM | POA: Insufficient documentation

## 2015-05-16 DIAGNOSIS — Z791 Long term (current) use of non-steroidal anti-inflammatories (NSAID): Secondary | ICD-10-CM | POA: Diagnosis not present

## 2015-05-16 DIAGNOSIS — K8020 Calculus of gallbladder without cholecystitis without obstruction: Secondary | ICD-10-CM | POA: Diagnosis not present

## 2015-05-16 DIAGNOSIS — I10 Essential (primary) hypertension: Secondary | ICD-10-CM | POA: Insufficient documentation

## 2015-05-16 DIAGNOSIS — Z419 Encounter for procedure for purposes other than remedying health state, unspecified: Secondary | ICD-10-CM

## 2015-05-16 HISTORY — DX: Malignant (primary) neoplasm, unspecified: C80.1

## 2015-05-16 HISTORY — DX: Anemia, unspecified: D64.9

## 2015-05-16 HISTORY — DX: Unspecified hemorrhoids: K64.9

## 2015-05-16 HISTORY — PX: CHOLECYSTECTOMY: SHX55

## 2015-05-16 HISTORY — PX: HEMORRHOID SURGERY: SHX153

## 2015-05-16 HISTORY — DX: Other complications of anesthesia, initial encounter: T88.59XA

## 2015-05-16 HISTORY — DX: Adverse effect of unspecified anesthetic, initial encounter: T41.45XA

## 2015-05-16 LAB — CBC
HEMATOCRIT: 40.3 % (ref 36.0–46.0)
Hemoglobin: 13.5 g/dL (ref 12.0–15.0)
MCH: 32.1 pg (ref 26.0–34.0)
MCHC: 33.5 g/dL (ref 30.0–36.0)
MCV: 95.7 fL (ref 78.0–100.0)
Platelets: 234 10*3/uL (ref 150–400)
RBC: 4.21 MIL/uL (ref 3.87–5.11)
RDW: 12.3 % (ref 11.5–15.5)
WBC: 4.5 10*3/uL (ref 4.0–10.5)

## 2015-05-16 LAB — HCG, SERUM, QUALITATIVE: Preg, Serum: NEGATIVE

## 2015-05-16 SURGERY — LAPAROSCOPIC CHOLECYSTECTOMY WITH INTRAOPERATIVE CHOLANGIOGRAM
Anesthesia: General

## 2015-05-16 MED ORDER — FENTANYL CITRATE (PF) 100 MCG/2ML IJ SOLN
INTRAMUSCULAR | Status: AC
  Filled 2015-05-16: qty 2

## 2015-05-16 MED ORDER — LACTATED RINGERS IR SOLN
Status: DC | PRN
  Administered 2015-05-16: 1

## 2015-05-16 MED ORDER — HYALURONIDASE HUMAN 150 UNIT/ML IJ SOLN
INTRAMUSCULAR | Status: AC
  Filled 2015-05-16: qty 1

## 2015-05-16 MED ORDER — IOHEXOL 300 MG/ML  SOLN
INTRAMUSCULAR | Status: DC | PRN
  Administered 2015-05-16: 5 mL via INTRAVENOUS

## 2015-05-16 MED ORDER — PHENYLEPHRINE HCL 10 MG/ML IJ SOLN
INTRAMUSCULAR | Status: DC | PRN
  Administered 2015-05-16 (×3): 80 ug via INTRAVENOUS

## 2015-05-16 MED ORDER — FLEET ENEMA 7-19 GM/118ML RE ENEM: 1.0000 | ENEMA | Freq: Once | RECTAL | Status: DC

## 2015-05-16 MED ORDER — CHLORHEXIDINE GLUCONATE 4 % EX LIQD: 1.0000 "application " | Freq: Once | CUTANEOUS | Status: DC

## 2015-05-16 MED ORDER — GLYCOPYRROLATE 0.2 MG/ML IJ SOLN
INTRAMUSCULAR | Status: AC
  Filled 2015-05-16: qty 3

## 2015-05-16 MED ORDER — CEFAZOLIN SODIUM-DEXTROSE 2-3 GM-% IV SOLR
2.0000 g | INTRAVENOUS | Status: AC
  Administered 2015-05-16: 2 g via INTRAVENOUS

## 2015-05-16 MED ORDER — FENTANYL CITRATE (PF) 100 MCG/2ML IJ SOLN
INTRAMUSCULAR | Status: DC | PRN
  Administered 2015-05-16 (×4): 100 ug via INTRAVENOUS
  Administered 2015-05-16: 50 ug via INTRAVENOUS

## 2015-05-16 MED ORDER — PROPOFOL 10 MG/ML IV BOLUS
INTRAVENOUS | Status: AC
  Filled 2015-05-16: qty 20

## 2015-05-16 MED ORDER — ROCURONIUM BROMIDE 100 MG/10ML IV SOLN
INTRAVENOUS | Status: DC | PRN
  Administered 2015-05-16: 40 mg via INTRAVENOUS
  Administered 2015-05-16: 10 mg via INTRAVENOUS

## 2015-05-16 MED ORDER — BUPIVACAINE LIPOSOME 1.3 % IJ SUSP
20.0000 mL | Freq: Once | INTRAMUSCULAR | Status: AC
  Administered 2015-05-16: 20 mL
  Filled 2015-05-16: qty 20

## 2015-05-16 MED ORDER — LACTATED RINGERS IV SOLN
INTRAVENOUS | Status: DC
  Administered 2015-05-16 (×2): via INTRAVENOUS
  Administered 2015-05-16: 1000 mL via INTRAVENOUS

## 2015-05-16 MED ORDER — ONDANSETRON HCL 4 MG/2ML IJ SOLN
INTRAMUSCULAR | Status: AC
Start: 2015-05-16 — End: 2015-05-16
  Filled 2015-05-16: qty 2

## 2015-05-16 MED ORDER — HYDROMORPHONE HCL 1 MG/ML IJ SOLN
INTRAMUSCULAR | Status: AC
  Filled 2015-05-16: qty 2

## 2015-05-16 MED ORDER — HYDROMORPHONE HCL 1 MG/ML IJ SOLN
0.2500 mg | INTRAMUSCULAR | Status: DC | PRN
  Administered 2015-05-16 (×4): 0.5 mg via INTRAVENOUS

## 2015-05-16 MED ORDER — PROMETHAZINE HCL 25 MG PO TABS: 25.0000 mg | ORAL_TABLET | Freq: Four times a day (QID) | ORAL | Status: DC | PRN

## 2015-05-16 MED ORDER — DEXAMETHASONE SODIUM PHOSPHATE 10 MG/ML IJ SOLN
INTRAMUSCULAR | Status: DC | PRN
  Administered 2015-05-16: 10 mg via INTRAVENOUS

## 2015-05-16 MED ORDER — LIDOCAINE HCL (CARDIAC) 20 MG/ML IV SOLN
INTRAVENOUS | Status: DC | PRN
  Administered 2015-05-16: 100 mg via INTRAVENOUS

## 2015-05-16 MED ORDER — MIDAZOLAM HCL 2 MG/2ML IJ SOLN
INTRAMUSCULAR | Status: AC
  Filled 2015-05-16: qty 2

## 2015-05-16 MED ORDER — FENTANYL CITRATE (PF) 100 MCG/2ML IJ SOLN
INTRAMUSCULAR | Status: AC
Start: 2015-05-16 — End: 2015-05-16
  Filled 2015-05-16: qty 2

## 2015-05-16 MED ORDER — PROMETHAZINE HCL 25 MG/ML IJ SOLN
6.2500 mg | Freq: Four times a day (QID) | INTRAMUSCULAR | Status: DC | PRN
  Administered 2015-05-16: 6.25 mg via INTRAVENOUS

## 2015-05-16 MED ORDER — OXYCODONE-ACETAMINOPHEN 5-325 MG PO TABS
1.0000 | ORAL_TABLET | Freq: Once | ORAL | Status: AC
  Administered 2015-05-16: 1 via ORAL
  Filled 2015-05-16: qty 1

## 2015-05-16 MED ORDER — DEXAMETHASONE SODIUM PHOSPHATE 10 MG/ML IJ SOLN
INTRAMUSCULAR | Status: AC
  Filled 2015-05-16: qty 1

## 2015-05-16 MED ORDER — ACETAMINOPHEN 10 MG/ML IV SOLN
1000.0000 mg | Freq: Once | INTRAVENOUS | Status: AC
  Administered 2015-05-16: 1000 mg via INTRAVENOUS

## 2015-05-16 MED ORDER — PHENYLEPHRINE 40 MCG/ML (10ML) SYRINGE FOR IV PUSH (FOR BLOOD PRESSURE SUPPORT)
PREFILLED_SYRINGE | INTRAVENOUS | Status: AC
Start: 2015-05-16 — End: 2015-05-16
  Filled 2015-05-16: qty 10

## 2015-05-16 MED ORDER — ROCURONIUM BROMIDE 100 MG/10ML IV SOLN
INTRAVENOUS | Status: AC
  Filled 2015-05-16: qty 1

## 2015-05-16 MED ORDER — MIDAZOLAM HCL 5 MG/5ML IJ SOLN
INTRAMUSCULAR | Status: DC | PRN
  Administered 2015-05-16: 2 mg via INTRAVENOUS

## 2015-05-16 MED ORDER — PROMETHAZINE HCL 25 MG/ML IJ SOLN
12.5000 mg | Freq: Once | INTRAMUSCULAR | Status: AC
  Administered 2015-05-16: 6.25 mg via INTRAVENOUS
  Filled 2015-05-16: qty 1

## 2015-05-16 MED ORDER — FENTANYL CITRATE (PF) 250 MCG/5ML IJ SOLN
INTRAMUSCULAR | Status: AC
  Filled 2015-05-16: qty 5

## 2015-05-16 MED ORDER — PROPOFOL 10 MG/ML IV BOLUS
INTRAVENOUS | Status: DC | PRN
  Administered 2015-05-16: 200 mg via INTRAVENOUS

## 2015-05-16 MED ORDER — BUPIVACAINE-EPINEPHRINE (PF) 0.25% -1:200000 IJ SOLN
INTRAMUSCULAR | Status: AC
  Filled 2015-05-16: qty 30

## 2015-05-16 MED ORDER — OXYCODONE-ACETAMINOPHEN 5-325 MG PO TABS: 1.0000 | ORAL_TABLET | ORAL | Status: DC | PRN

## 2015-05-16 MED ORDER — HYALURONIDASE OVINE 200 UNIT/ML IJ SOLN
INTRAMUSCULAR | Status: DC | PRN
  Administered 2015-05-16: 150 [IU] via SUBCUTANEOUS

## 2015-05-16 MED ORDER — SODIUM CHLORIDE 0.9 % IJ SOLN
INTRAMUSCULAR | Status: AC
  Filled 2015-05-16: qty 10

## 2015-05-16 MED ORDER — NEOSTIGMINE METHYLSULFATE 10 MG/10ML IV SOLN
INTRAVENOUS | Status: AC
  Filled 2015-05-16: qty 1

## 2015-05-16 MED ORDER — ONDANSETRON HCL 4 MG/2ML IJ SOLN
INTRAMUSCULAR | Status: DC | PRN
  Administered 2015-05-16: 4 mg via INTRAVENOUS

## 2015-05-16 MED ORDER — NEOSTIGMINE METHYLSULFATE 10 MG/10ML IV SOLN
INTRAVENOUS | Status: DC | PRN
  Administered 2015-05-16: 4 mg via INTRAVENOUS

## 2015-05-16 MED ORDER — CEFAZOLIN SODIUM-DEXTROSE 2-3 GM-% IV SOLR
INTRAVENOUS | Status: AC
  Filled 2015-05-16: qty 50

## 2015-05-16 MED ORDER — FENTANYL CITRATE (PF) 100 MCG/2ML IJ SOLN: INTRAMUSCULAR | Status: DC | PRN

## 2015-05-16 MED ORDER — LIDOCAINE HCL (CARDIAC) 20 MG/ML IV SOLN
INTRAVENOUS | Status: AC
Start: 2015-05-16 — End: 2015-05-16
  Filled 2015-05-16: qty 5

## 2015-05-16 MED ORDER — GLYCOPYRROLATE 0.2 MG/ML IJ SOLN
INTRAMUSCULAR | Status: DC | PRN
  Administered 2015-05-16: 0.6 mg via INTRAVENOUS

## 2015-05-16 MED ORDER — FENTANYL CITRATE (PF) 100 MCG/2ML IJ SOLN
25.0000 ug | INTRAMUSCULAR | Status: DC | PRN
  Administered 2015-05-16: 75 ug via INTRAVENOUS
  Administered 2015-05-16: 25 ug via INTRAVENOUS

## 2015-05-16 MED ORDER — SODIUM CHLORIDE 0.9 % IJ SOLN
INTRAMUSCULAR | Status: AC
  Filled 2015-05-16: qty 20

## 2015-05-16 MED ORDER — SUCCINYLCHOLINE CHLORIDE 20 MG/ML IJ SOLN
INTRAMUSCULAR | Status: DC | PRN
  Administered 2015-05-16: 100 mg via INTRAVENOUS

## 2015-05-16 SURGICAL SUPPLY — 53 items
APL SKNCLS STERI-STRIP NONHPOA (GAUZE/BANDAGES/DRESSINGS) ×1
APPLIER CLIP ROT 10 11.4 M/L (STAPLE) ×2
APR CLP MED LRG 11.4X10 (STAPLE) ×1
BAG SPEC RTRVL LRG 6X4 10 (ENDOMECHANICALS) ×1
BENZOIN TINCTURE PRP APPL 2/3 (GAUZE/BANDAGES/DRESSINGS) ×2 IMPLANT
BLADE EXTENDED COATED 6.5IN (ELECTRODE) IMPLANT
BLADE HEX COATED 2.75 (ELECTRODE) ×2 IMPLANT
BRIEF STRETCH FOR OB PAD LRG (UNDERPADS AND DIAPERS) ×2 IMPLANT
CATH REDDICK CHOLANGI 4FR 50CM (CATHETERS) ×1 IMPLANT
CHLORAPREP W/TINT 26ML (MISCELLANEOUS) ×2 IMPLANT
CLIP APPLIE ROT 10 11.4 M/L (STAPLE) ×1 IMPLANT
COVER MAYO STAND STRL (DRAPES) ×2 IMPLANT
COVER SURGICAL LIGHT HANDLE (MISCELLANEOUS) ×2 IMPLANT
DECANTER SPIKE VIAL GLASS SM (MISCELLANEOUS) ×3 IMPLANT
DRAPE C-ARM 42X120 X-RAY (DRAPES) ×2 IMPLANT
DRAPE LAPAROSCOPIC ABDOMINAL (DRAPES) ×2 IMPLANT
DRAPE LAPAROTOMY T 102X78X121 (DRAPES) ×2 IMPLANT
DRSG PAD ABDOMINAL 8X10 ST (GAUZE/BANDAGES/DRESSINGS) ×1 IMPLANT
ELECT PENCIL ROCKER SW 15FT (MISCELLANEOUS) ×2 IMPLANT
ELECT REM PT RETURN 9FT ADLT (ELECTROSURGICAL) ×4
ELECTRODE REM PT RTRN 9FT ADLT (ELECTROSURGICAL) ×1 IMPLANT
GAUZE SPONGE 4X4 12PLY STRL (GAUZE/BANDAGES/DRESSINGS) ×2 IMPLANT
GAUZE SPONGE 4X4 16PLY XRAY LF (GAUZE/BANDAGES/DRESSINGS) ×2 IMPLANT
GLOVE BIOGEL PI IND STRL 7.5 (GLOVE) ×1 IMPLANT
GLOVE BIOGEL PI INDICATOR 7.5 (GLOVE) ×1
GLOVE ECLIPSE 7.5 STRL STRAW (GLOVE) ×2 IMPLANT
GOWN STRL REUS W/TWL LRG LVL3 (GOWN DISPOSABLE) ×2 IMPLANT
GOWN STRL REUS W/TWL XL LVL3 (GOWN DISPOSABLE) ×8 IMPLANT
HEMOSTAT SNOW SURGICEL 2X4 (HEMOSTASIS) IMPLANT
HEMOSTAT SURGICEL 4X8 (HEMOSTASIS) IMPLANT
KIT BASIN OR (CUSTOM PROCEDURE TRAY) ×2 IMPLANT
LIQUID BAND (GAUZE/BANDAGES/DRESSINGS) ×2 IMPLANT
LUBRICANT JELLY K Y 4OZ (MISCELLANEOUS) ×2 IMPLANT
NS IRRIG 1000ML POUR BTL (IV SOLUTION) ×4 IMPLANT
PACK BASIC VI WITH GOWN DISP (CUSTOM PROCEDURE TRAY) ×2 IMPLANT
POUCH SPECIMEN RETRIEVAL 10MM (ENDOMECHANICALS) ×1 IMPLANT
SCISSORS LAP 5X35 DISP (ENDOMECHANICALS) ×2 IMPLANT
SET CHOLANGIOGRAPH MIX (MISCELLANEOUS) ×2 IMPLANT
SET IRRIG TUBING LAPAROSCOPIC (IRRIGATION / IRRIGATOR) ×2 IMPLANT
SLEEVE XCEL OPT CAN 5 100 (ENDOMECHANICALS) ×2 IMPLANT
SPONGE HEMORRHOID 8X3CM (HEMOSTASIS) ×1 IMPLANT
STAPLER PROXIMATE HCS (STAPLE) ×1 IMPLANT
STAPLER VISISTAT 35W (STAPLE) ×2 IMPLANT
SUT MNCRL AB 4-0 PS2 18 (SUTURE) ×2 IMPLANT
SUT PROLENE 2 0 BLUE (SUTURE) ×2 IMPLANT
SUT VIC AB 4-0 SH 18 (SUTURE) IMPLANT
SYR CONTROL 10ML LL (SYRINGE) ×2 IMPLANT
TOWEL OR 17X26 10 PK STRL BLUE (TOWEL DISPOSABLE) ×2 IMPLANT
TRAY LAPAROSCOPIC (CUSTOM PROCEDURE TRAY) ×2 IMPLANT
TROCAR BLADELESS OPT 5 100 (ENDOMECHANICALS) ×2 IMPLANT
TROCAR XCEL BLUNT TIP 100MML (ENDOMECHANICALS) ×2 IMPLANT
TROCAR XCEL NON-BLD 11X100MML (ENDOMECHANICALS) ×2 IMPLANT
YANKAUER SUCT BULB TIP 10FT TU (MISCELLANEOUS) ×2 IMPLANT

## 2015-05-16 NOTE — Anesthesia Preprocedure Evaluation (Signed)
Anesthesia Evaluation  Patient identified by MRN, date of birth, ID band Patient awake    Reviewed: Allergy & Precautions, H&P , Patient's Chart, lab work & pertinent test results, reviewed documented beta blocker date and time   Airway Mallampati: II  TM Distance: >3 FB Neck ROM: full    Dental no notable dental hx.    Pulmonary    Pulmonary exam normal breath sounds clear to auscultation       Cardiovascular hypertension,  Rhythm:regular Rate:Normal     Neuro/Psych    GI/Hepatic   Endo/Other    Renal/GU      Musculoskeletal   Abdominal   Peds  Hematology   Anesthesia Other Findings   Reproductive/Obstetrics                             Anesthesia Physical Anesthesia Plan  ASA: II  Anesthesia Plan: General   Post-op Pain Management:    Induction: Intravenous  Airway Management Planned: Oral ETT  Additional Equipment:   Intra-op Plan:   Post-operative Plan: Extubation in OR  Informed Consent: I have reviewed the patients History and Physical, chart, labs and discussed the procedure including the risks, benefits and alternatives for the proposed anesthesia with the patient or authorized representative who has indicated his/her understanding and acceptance.   Dental Advisory Given and Dental advisory given  Plan Discussed with: CRNA and Surgeon  Anesthesia Plan Comments: (  Discussed general anesthesia, including possible nausea, instrumentation of airway, sore throat,pulmonary aspiration, etc. I asked if the were any outstanding questions, or  concerns before we proceeded. )        Anesthesia Quick Evaluation  

## 2015-05-16 NOTE — H&P (Signed)
History of Present Illness Marland Kitchen T. Juquan Reznick MD; 04/22/2015 10:27 AM) Patient words: gallbladder.  The patient is a 35 year old female who presents for evaluation of gall stones. She is a 35 year old female, generally healthy, who is 7 months postpartum from her third pregnancy. About 3 weeks ago she developed the sudden onset of severe pressure-like epigastric and right upper quadrant abdominal pain. This quickly became very intense and she presented to the emergency room. No nausea or vomiting. She has not had any previous similar problems are chronic GI complaints. Gallbladder ultrasound was obtained showing multiple gallstones. She was treated in the emergency department and her symptoms resolved. She has not had any repeat episodes since that time. No fever chills or jaundice.  Also she has had chronic worsening problems with hemorrhoids and prolapse. This became fairly severe after her second pregnancy but then since her third pregnancy have been even worse. She describes daily prolapse and discomfort and requires manual reduction. Minimal occasional bleeding. No severe pain.   Other Problems Davy Pique Bynum, CMA; 04/22/2015 9:30 AM) Cholelithiasis Gastroesophageal Reflux Disease Hemorrhoids Migraine Headache  Past Surgical History Marjean Donna, CMA; 04/22/2015 9:30 AM) Breast Augmentation Bilateral. Oral Surgery  Diagnostic Studies History Marjean Donna, CMA; 04/22/2015 9:30 AM) Colonoscopy never Mammogram never Pap Smear 1-5 years ago  Allergies Davy Pique Bynum, CMA; 04/22/2015 9:32 AM) HYDROcodone Bitartrate *CHEMICALS* Percocet *ANALGESICS - OPIOID* Sulfa Antibiotics  Medication History (Sonya Bynum, CMA; 04/22/2015 9:33 AM) Oxycodone-Acetaminophen (5-325MG  Tablet, Oral as needed) Active. Frova (2.5MG  Tablet, Oral) Active. Advil (100MG  Tablet Chewable, Oral as needed) Active. Prenatal 1 + Iron (Oral) Active. Imitrex (50MG  Tablet, Oral as needed)  Active. Medications Reconciled  Social History Marjean Donna, CMA; 04/22/2015 9:30 AM) Alcohol use Occasional alcohol use. Caffeine use Coffee. No drug use Tobacco use Never smoker.  Family History Marjean Donna, [place]; 04/22/2015 9:30 AM) Arthritis Mother. Cancer Father. Depression Father, Sister. Migraine Headache Sister. Thyroid problems Mother, Sister.  Pregnancy / Birth History Marjean Donna, [place]; 04/22/2015 9:30 AM) Age at menarche 62 years. Contraceptive History Intrauterine device, Oral contraceptives. Gravida 3 Irregular periods Maternal age 55-30 Para 2    Review of Systems ([place]; 04/22/2015 9:30 AM) General Not Present- Appetite Loss, Chills, Fatigue, Fever, Night Sweats, Weight Gain and Weight Loss. Skin Not Present- Change in Wart/Mole, Dryness, Hives, Jaundice, New Lesions, Non-Healing Wounds, Rash and Ulcer. HEENT Not Present- Earache, Hearing Loss, Hoarseness, Nose Bleed, Oral Ulcers, Ringing in the Ears, Seasonal Allergies, Sinus Pain, Sore Throat, Visual Disturbances, Wears glasses/contact lenses and Yellow Eyes. Respiratory Not Present- Bloody sputum, Chronic Cough, Difficulty Breathing, Snoring and Wheezing. Breast Not Present- Breast Mass, Breast Pain, Nipple Discharge and Skin Changes. Cardiovascular Not Present- Chest Pain, Difficulty Breathing Lying Down, Leg Cramps, Palpitations, Rapid Heart Rate, Shortness of Breath and Swelling of Extremities. Gastrointestinal Present- Bloating, Change in Bowel Habits, Excessive gas, Hemorrhoids, Indigestion and Rectal Pain. Not Present- Abdominal Pain, Bloody Stool, Chronic diarrhea, Constipation, Difficulty Swallowing, Gets full quickly at meals, Nausea and Vomiting. Female Genitourinary Not Present- Frequency, Nocturia, Painful Urination, Pelvic Pain and Urgency. Musculoskeletal Not Present- Back Pain, Joint Pain, Joint Stiffness, Muscle Pain, Muscle Weakness and Swelling of  Extremities. Neurological Not Present- Decreased Memory, Fainting, Headaches, Numbness, Seizures, Tingling, Tremor, Trouble walking and Weakness. Psychiatric Not Present- Anxiety, Bipolar, Change in Sleep Pattern, Depression, Fearful and Frequent crying. Endocrine Not Present- Cold Intolerance, Excessive Hunger, Hair Changes, Heat Intolerance, Hot flashes and New Diabetes. Hematology Not Present- Easy Bruising, Excessive bleeding, Gland problems, HIV and  Persistent Infections.  Vitals (Sonya Bynum CMA; 04/22/2015 9:31 AM) 04/22/2015 9:31 AM Weight: 151 lb Height: 66in Body Surface Area: 1.77 m Body Mass Index: 24.37 kg/m  Temp.: 97.63F(Temporal)  Pulse: 77 (Regular)  BP: 126/80 (Sitting, Left Arm, Standard)       Physical Exam Marland Kitchen T. Giulliana Mcroberts MD; 04/22/2015 10:32 AM) The physical exam findings are as follows: Note:General: Alert, healthy-appearing Caucasian female, in no distress Skin: Warm and dry without rash or infection. HEENT: No palpable masses or thyromegaly. Sclera nonicteric. Pupils equal round and reactive. Oropharynx clear. Lymph nodes: No cervical, supraclavicular, or inguinal nodes palpable. Lungs: Breath sounds clear and equal. No wheezing or increased work of breathing. Cardiovascular: Regular rate and rhythm without murmer. No JVD or edema. Peripheral pulses intact. No carotid bruits. Abdomen: Nondistended. Mild right upper quadrant tenderness No masses palpable. No organomegaly. No palpable hernias. Rectal: Mild prolapse externally Extremities: No edema or joint swelling or deformity. No chronic venous stasis changes. Neurologic: Alert and fully oriented. Gait normal. No focal weakness. Psychiatric: Normal mood and affect. Thought content appropriate with normal judgement and insight  Anoscopy: Circumferential significant but noninflamed and nonbleeding internal hemorrhoids    Assessment & Plan Marland Kitchen T. Aubriauna Riner MD; 04/22/2015 10:33  AM) SYMPTOMATIC CHOLELITHIASIS (K80.20) Impression: Classic episode of severe biliary colic and gallbladder ultrasound has shown multiple gallstones. I recommend proceeding with laparoscopic cholecystectomy with cholangiogram. I discussed the procedure in detail. The patient was given Neurosurgeon. We discussed the risks and benefits of a laparoscopic cholecystectomy and possible cholangiogram including, but not limited to, bleeding, infection, injury to surrounding structures such as the intestine or liver, bile leak, retained gallstones, need to convert to an open procedure, prolonged diarrhea, blood clots such as DVT, common bile duct injury, anesthesia risks, and possible need for additional procedures. The likelihood of improvement in symptoms and return to the patient's normal status is good. We discussed the typical post-operative recovery course. All questions were answered . Current Plans Pt Education - Pamphlet Given - Laparoscopic Gallbladder Surgery: discussed with patient and provided information. HEMORRHOIDS, INTERNAL (K64.8) Impression: Markedly symptomatic grade 4 internal hemorrhoids. I discussed the diagnosis and treatment options including banding in the office, open hemorrhoidectomy or PPH. I think she would be a very good candidate for PPH. I discussed this procedure in detail including its nature and expected results as well as risks of bleeding, infection or rare instances of sphincter injury or rectovaginal fistula. She was given literature regarding the procedure. Current Plans Pt Education - Pamphlet Given - The Hemorrhoid Book: discussed with patient and provided information. Schedule for Surgery Laparoscopic cholecystectomy with intraoperative cholangiogram and PPH

## 2015-05-16 NOTE — Progress Notes (Signed)
Laparoscopic Cholecystectomy wound class II  Hemmorhoidectomy wound class IV

## 2015-05-16 NOTE — Anesthesia Postprocedure Evaluation (Signed)
Anesthesia Post Note  Patient: Maria Berg  Procedure(s) Performed: Procedure(s) (LRB): LAPAROSCOPIC CHOLECYSTECTOMY WITH INTRAOPERATIVE CHOLANGIOGRAM (N/A) HEMORRHOIDECTOMY PROLAPSED (N/A)  Patient location during evaluation: PACU Anesthesia Type: General Level of consciousness: sedated Pain management: satisfactory to patient Vital Signs Assessment: post-procedure vital signs reviewed and stable Respiratory status: spontaneous breathing Cardiovascular status: stable Anesthetic complications: no    Last Vitals:  Filed Vitals:   05/16/15 1500 05/16/15 1518  BP: 109/67 119/56  Pulse: 94 101  Temp: 36.6 C 36.6 C  Resp: 14 14    Last Pain:  Filed Vitals:   05/16/15 1526  PainSc: 2                  Abimael Zeiter EDWARD
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
## 2015-05-16 NOTE — Transfer of Care (Signed)
Immediate Anesthesia Transfer of Care Note  Patient: Maria Berg  Procedure(s) Performed: Procedure(s): LAPAROSCOPIC CHOLECYSTECTOMY WITH INTRAOPERATIVE CHOLANGIOGRAM (N/A) HEMORRHOIDECTOMY PROLAPSED (N/A)  Patient Location: PACU  Anesthesia Type:General  Level of Consciousness: sedated  Airway & Oxygen Therapy: Patient Spontanous Breathing and Patient connected to face mask oxygen  Post-op Assessment: Report given to RN and Post -op Vital signs reviewed and stable  Post vital signs: Reviewed and stable  Last Vitals:  Filed Vitals:   05/16/15 0852  BP: 134/85  Pulse: 87  Temp: 36.6 C  Resp: 18    Complications: No apparent anesthesia complications

## 2015-05-16 NOTE — Discharge Instructions (Signed)
[facility] ______[facility] °LAPAROSCOPIC SURGERY: POST OP INSTRUCTIONS °Always review your discharge instruction sheet given to you by the facility where your surgery was performed. °IF YOU HAVE DISABILITY OR FAMILY LEAVE FORMS, YOU MUST BRING THEM TO THE OFFICE FOR PROCESSING.   °DO NOT GIVE THEM TO YOUR DOCTOR. ° °1. A prescription for pain medication may be given to you upon discharge.  Take your pain medication as prescribed, if needed.  If narcotic pain medicine is not needed, then you may take acetaminophen (Tylenol) or ibuprofen (Advil) as needed. °2. Take your usually prescribed medications unless otherwise directed. °3. If you need a refill on your pain medication, please contact your pharmacy.  They will contact our office to request authorization. Prescriptions will not be filled after 5pm or on week-ends. °4. You should follow a light diet the first few days after arrival home, such as soup and crackers, etc.  Be sure to include lots of fluids daily. °5. Most patients will experience some swelling and bruising in the area of the incisions.  Ice packs will help.  Swelling and bruising can take several days to resolve.  °6. It is common to experience some constipation if taking pain medication after surgery.  Increasing fluid intake and taking a stool softener (such as Colace) will usually help or prevent this problem from occurring.  A mild laxative (Milk of Magnesia or Miralax) should be taken according to package instructions if there are no bowel movements after 48 hours. °7. Unless discharge instructions indicate otherwise, you may remove your bandages 24-48 hours after surgery, and you may shower at that time.  You may have steri-strips (small skin tapes) in place directly over the incision.  These strips should be left on the skin for 7-10 days.  If your surgeon used skin glue on the incision, you may shower in 24 hours.  The glue will flake off over the next 2-3 weeks.  Any sutures or  staples will be removed at the office during your follow-up visit. °8. ACTIVITIES:  You may resume regular (light) daily activities beginning the next day--such as daily self-care, walking, climbing stairs--gradually increasing activities as tolerated.  You may have sexual intercourse when it is comfortable.  Refrain from any heavy lifting or straining until approved by your doctor. °a. You may drive when you are no longer taking prescription pain medication, you can comfortably wear a seatbelt, and you can safely maneuver your car and apply brakes. °b. RETURN TO WORK:  __________________________________________________________ °9. You should see your doctor in the office for a follow-up appointment approximately 2-3 weeks after your surgery.  Make sure that you call for this appointment within a day or two after you arrive home to insure a convenient appointment time. °10. OTHER INSTRUCTIONS: __________________________________________________________________________________________________________________________ __________________________________________________________________________________________________________________________ °WHEN TO CALL YOUR DOCTOR: °1. Fever over 101.0 °2. Inability to urinate °3. Continued bleeding from incision. °4. Increased pain, redness, or drainage from the incision. °5. Increasing abdominal pain ° °The clinic staff is available to answer your questions during regular business hours.  Please don’t hesitate to call and ask to speak to one of the nurses for clinical concerns.  If you have a medical emergency, go to the nearest emergency room or call 911.  A surgeon from [facility] is always on call at the hospital. °[address], [address], [place], [place]  [address] ? [address] [address], [place],    [address] °[phone] ? [phone] ? FAX [phone] °Web site:   [email or web address]   [facility] _______[facility]  RECTAL  SURGERY POST OP INSTRUCTIONS: POST OP INSTRUCTIONS  Always review your discharge instruction sheet given to you by the facility where your surgery was performed. IF YOU HAVE DISABILITY OR FAMILY LEAVE FORMS, YOU MUST BRING THEM TO THE OFFICE FOR PROCESSING.   DO NOT GIVE THEM TO YOUR DOCTOR.  11. A  prescription for pain medication may be given to you upon discharge.  Take your pain medication as prescribed, if needed.  If narcotic pain medicine is not needed, then you may take acetaminophen (Tylenol) or ibuprofen (Advil) as needed. 12. Take your usually prescribed medications unless otherwise directed. 13. If you need a refill on your pain medication, please contact your pharmacy.  They will contact our office to request authorization. Prescriptions will not be filled after 5 pm or on week-ends. 14. You should follow a light diet the first 48 hours after arrival home, such as soup and crackers, etc.  Be sure to include lots of fluids daily.  Resume your normal diet 2-3 days after surgery.. 15. Most patients will experience some swelling and discomfort in the rectal area. Ice packs, reclining and warm tub soaks will help.  Swelling and discomfort can take several days to resolve.  16. It is common to experience some constipation if taking pain medication after surgery.  Increasing fluid intake and taking a stool softener (such as Colace) will usually help or prevent this problem from occurring.  A mild laxative (Milk of Magnesia or Miralax) should be taken according to package directions if there are no bowel movements after 48 hours. 17. Unless discharge instructions indicate otherwise, leave your bandage dry and in place for 24 hours, or remove the bandage if you have a bowel movement. You may notice a small amount of bleeding with bowel movements for the first few days. You may have some packing in the rectum which will come out over the first day or two. You will need to wear an absorbent pad or soft  cotton gauze in your underwear until the drainage stops.it. 18. ACTIVITIES:  You may resume regular (light) daily activities beginning the next day--such as daily self-care, walking, climbing stairs--gradually increasing activities as tolerated.  You may have sexual intercourse when it is comfortable.  Refrain from any heavy lifting or straining until approved by your doctor. a. You may drive when you are no longer taking prescription pain medication, you can comfortably wear a seatbelt, and you can safely maneuver your car and apply brakes. b. RETURN TO WORK: : ____________________ c.  19. You should see your doctor in the office for a follow-up appointment approximately 2-3 weeks after your surgery.  Make sure that you call for this appointment within a day or two after you arrive home to insure a convenient appointment time. 20. OTHER INSTRUCTIONS:  __________________________________________________________________________________________________________________________________________________________________________________________  WHEN TO CALL YOUR DOCTOR: 6. Fever over 101.0 7. Inability to urinate 8. Nausea and/or vomiting 9. Extreme swelling or bruising 10. Continued bleeding from rectum. 11. Increased pain, redness, or drainage from the incision 12. Constipation  The clinic staff is available to answer your questions during regular business hours.  Please dont hesitate to call and ask to speak to one of the nurses for clinical concerns.  If you have a medical emergency, go to the nearest emergency room or call 911.  A surgeon from [facility] is always on call at the hospital   [address], [place],  [place], [place]  [address] ?  [address], [place], [place]   [address] [phone] ? [phone] ? FAX [phone] Web site: [email or web address]

## 2015-05-16 NOTE — Anesthesia Procedure Notes (Signed)
Procedure Name: Intubation Date/Time: 05/16/2015 11:47 AM Performed by: Lind Covert Pre-anesthesia Checklist: Patient identified, Emergency Drugs available, Suction available, Patient being monitored and Timeout performed Patient Re-evaluated:Patient Re-evaluated prior to inductionOxygen Delivery Method: Circle system utilized Preoxygenation: Pre-oxygenation with 100% oxygen Intubation Type: IV induction Laryngoscope Size: Mac and 4 Grade View: Grade I Tube type: Oral Tube size: 7.0 mm Number of attempts: 1 Airway Equipment and Method: Stylet Placement Confirmation: ETT inserted through vocal cords under direct vision,  positive ETCO2 and breath sounds checked- equal and bilateral Secured at: 21 cm Tube secured with: Tape Dental Injury: Teeth and Oropharynx as per pre-operative assessment

## 2015-05-17 ENCOUNTER — Encounter (HOSPITAL_COMMUNITY): Payer: Self-pay | Admitting: General Surgery

## 2016-04-19 ENCOUNTER — Encounter (INDEPENDENT_AMBULATORY_CARE_PROVIDER_SITE_OTHER): Payer: Self-pay

## 2016-04-19 ENCOUNTER — Ambulatory Visit (INDEPENDENT_AMBULATORY_CARE_PROVIDER_SITE_OTHER): Payer: BLUE CROSS/BLUE SHIELD | Admitting: Allergy

## 2016-04-19 ENCOUNTER — Encounter: Payer: Self-pay | Admitting: Allergy

## 2016-04-19 VITALS — BP 120/84 | HR 84 | Ht 67.0 in | Wt 157.6 lb

## 2016-04-19 DIAGNOSIS — T783XXD Angioneurotic edema, subsequent encounter: Secondary | ICD-10-CM

## 2016-04-19 DIAGNOSIS — L501 Idiopathic urticaria: Secondary | ICD-10-CM | POA: Diagnosis not present

## 2016-04-19 MED ORDER — MONTELUKAST SODIUM 10 MG PO TABS: 10.0000 mg | ORAL_TABLET | Freq: Every day | ORAL | 5 refills | Status: DC

## 2016-04-19 MED ORDER — RANITIDINE HCL 150 MG PO TABS: 150.0000 mg | ORAL_TABLET | Freq: Two times a day (BID) | ORAL | 5 refills | Status: DC

## 2016-04-19 NOTE — Progress Notes (Signed)
Follow-up Note  RE: Maria Berg MRN: [id number] DOB: [birth year] Date of Office Visit: 04/19/2016   History of present illness: Maria Berg is a 35 y.o. female presenting today for follow-up of urticaria.  She was last seen in our office on 01/03/15 by Dr. Shaune Leeks.    She has had a return of her hives for the past several weeks.  This is now her 3rd occurrence of hives since they started after the birth of her son in 2016.  Her hive episodes usually last several weeks at a time before going away.   She feels stress and hormones have been a trigger of her hives.  She started taking claritin in AM and zyrtec in PM 5-6 days ago.   She was taking ranitidine twice a day during her last flare up; she no longer has ranitidine.  She is also taking benadryl at night as the itching is worse at this time.   The hives are mostly in her thigh/groin area.  She also woke up today with right eye swelling and tearing.  She is going to [place] in about a week for vacation and is concerned about the appearance of the rash.  She is breastfeeding currently.       Review of systems: Review of Systems  Constitutional: Negative for chills, fever and malaise/fatigue.  HENT: Positive for congestion. Negative for ear discharge, ear pain, nosebleeds, sinus pain and sore throat.   Eyes: Positive for discharge and redness.  Respiratory: Negative for cough, shortness of breath and wheezing.   Cardiovascular: Negative for chest pain.  Gastrointestinal: Negative for heartburn, nausea and vomiting.  Skin: Positive for itching and rash.      All other systems negative unless noted above in HPI  Past medical/social/surgical/family history have been reviewed and are unchanged unless specifically indicated below.  No changes  Medication List:   Medication List       Accurate as of 04/19/16  1:06 PM. Always use your most recent med list.          BIOTIN PO Take 1 tablet by mouth daily.     FENUGREEK PO Take 1 tablet by mouth daily.   frovatriptan 2.5 MG tablet Commonly known as:  FROVA Take 2.5 mg by mouth as needed for migraine (If Imitrex failed on prior day or for hormone-related migraine). If recurs, may repeat after 2 hours. Max of 3 tabs in 24 hours.   ibuprofen 200 MG tablet Commonly known as:  ADVIL,MOTRIN Take 600 mg by mouth every 6 (six) hours as needed for headache, mild pain or moderate pain.   MAGNESIUM PO Take 1 tablet by mouth daily.   norethindrone 0.35 MG tablet Commonly known as:  MICRONOR,CAMILA,ERRIN Take 1 tablet by mouth daily before breakfast.   oxyCODONE-acetaminophen 5-325 MG tablet Commonly known as:  PERCOCET Take 1-2 tablets by mouth every 4 (four) hours as needed for severe pain.   prenatal multivitamin Tabs tablet Take 1 tablet by mouth daily.   promethazine 25 MG tablet Commonly known as:  PHENERGAN Take 1 tablet (25 mg total) by mouth every 6 (six) hours as needed for nausea.   sertraline 50 MG tablet Commonly known as:  ZOLOFT   SUMAtriptan 50 MG tablet Commonly known as:  IMITREX Take 50 mg by mouth 2 (two) times daily as needed for migraine. May repeat in 2 hours if migraine persist.       Known medication allergies: Allergies  Allergen Reactions  . Sulfa  Antibiotics Other (See Comments)    Never exposed Sister had live-threatening allergy to sulfa drug     Physical examination: Blood pressure 120/84, pulse 84, height 5\' 7"  (1.702 m), weight 157 lb 9.6 oz (71.5 kg), currently breastfeeding.  General: Alert, interactive, in no acute distress. HEENT: TMs pearly gray, turbinates minimally edematous with clear discharge, post-pharynx non erythematous.Periorbital erythema and mild edema of the right eye Neck: Supple without lymphadenopathy. Lungs: Clear to auscultation without wheezing, rhonchi or rales. {no increased work of breathing. CV: Normal S1, S2 without murmurs. Abdomen: Nondistended,  nontender. Skin: Scattered erythematous urticarial type lesions primarily located Chest , nonvesicular. Extremities:  No clubbing, cyanosis or edema. Neuro:   Grossly intact.  Diagnositics/Labs: None today  Assessment and plan:   Urticaria with angioedema Continue Claritin in AM and Zyrtec in PM Continue Raniditine 150mg  twice a day Start Singulair 10mg  at bed time If she still has symptoms despite above regimen then advised take 2 zyrtec in PM She may up take up to 2 antihistamine twice a day.  Provided with prednisone pack today for use if she remains symptomatic despite high-dose antihistamine regimen If her hives persist then she may benefit from a course of Xolair  She is to let us know if hives become chronic lasting more than 6 weeks in duration or if you develop marks/bruising from hives, joint pains or fevers   Follow-up in 2-3 months  I appreciate the opportunity to take part in [name] care. Please do not hesitate to contact me with questions.  Sincerely,   Prudy Feeler, MD Allergy/Immunology [facility]

## 2016-04-19 NOTE — Patient Instructions (Signed)
Hives  Continue Claritin in AM and Zyrtec in PM Continue Raniditine 150mg  twice a day Start Singulair 10mg  at bed time  If you still have symptoms then take 2 zyrtec in PM You may up take up to 2 antihistamine twice a day.   Provide with prednisone pack  Let us know if hives become chronic lasting more than 6 weeks in duration or if you develop marks/bruising from hives, joint pains or fevers   Follow-up in 2-3 months

## 2016-05-22 IMAGING — US US ABDOMEN COMPLETE
1 series · 14 of 25 positions shown · non-contrast
Comparison: None.

CLINICAL DATA: Patient with upper abdominal pain.

EXAM:
ULTRASOUND ABDOMEN COMPLETE

[Series 1: us abdomen complete · 0.18mm/px · 14 of 73 slices shown]
[im 1/73]
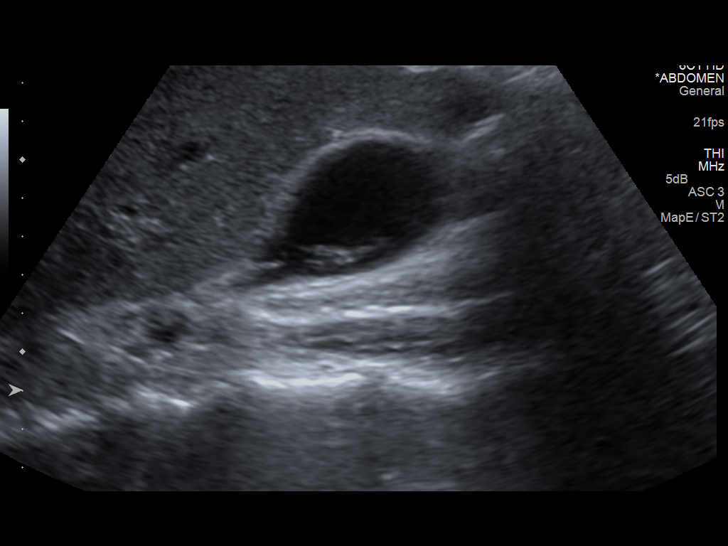
[im 7/73]
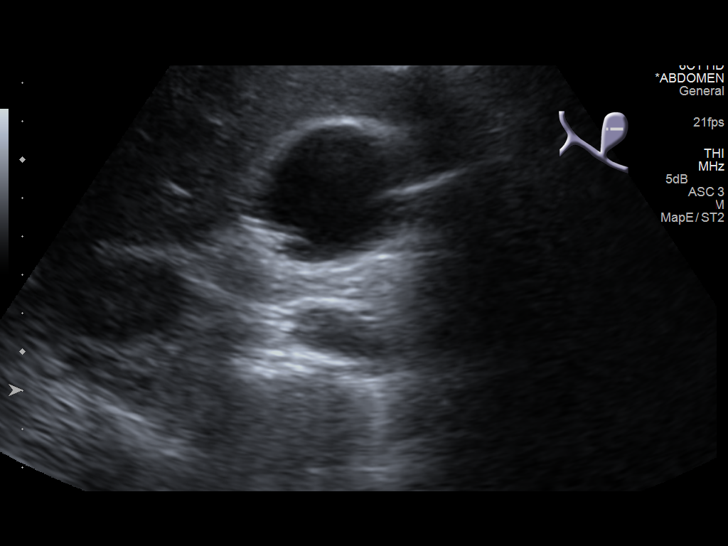
[im 13/73]
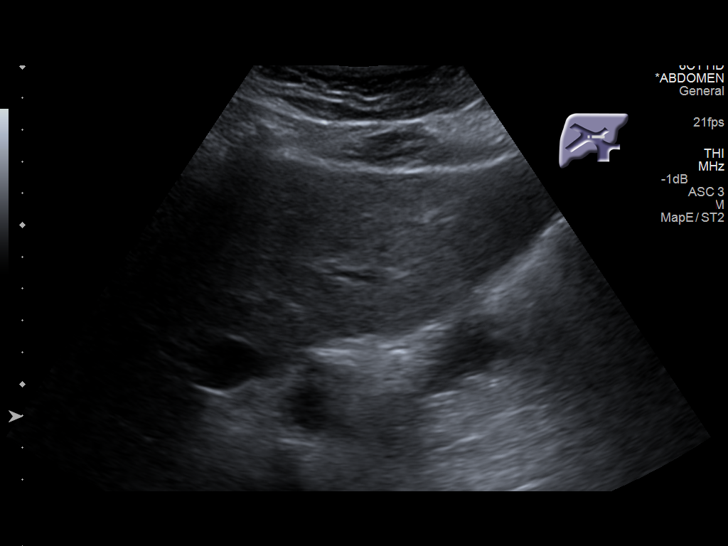
[im 19/73]
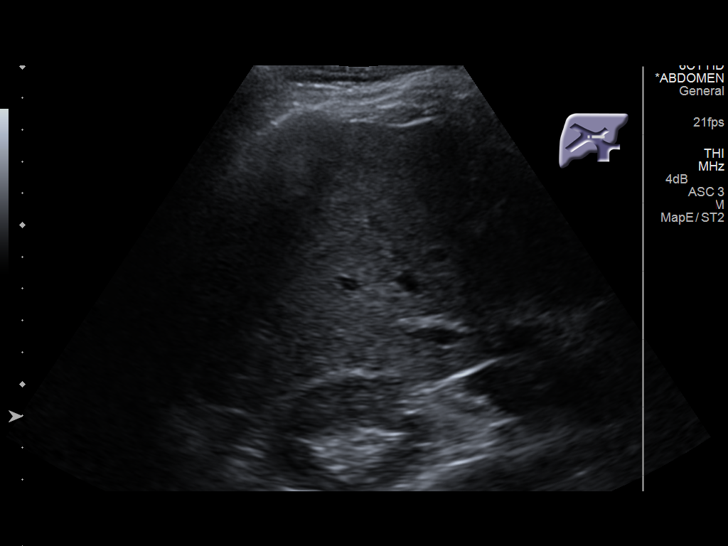
[im 25/73]
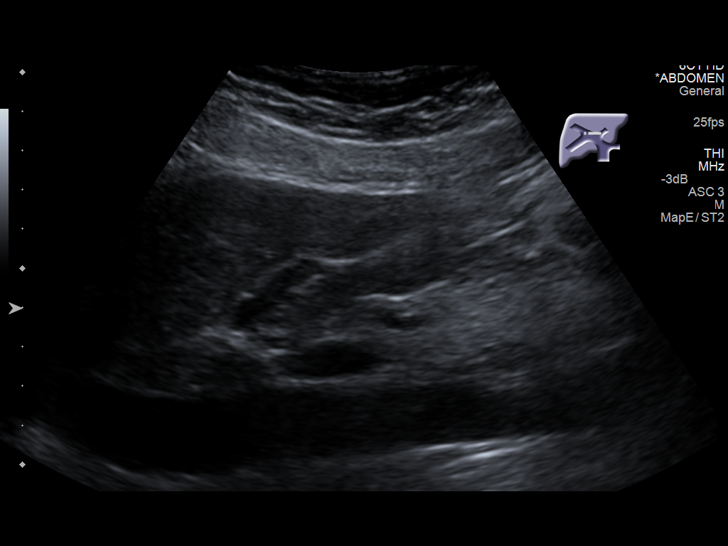
[im 28/73]
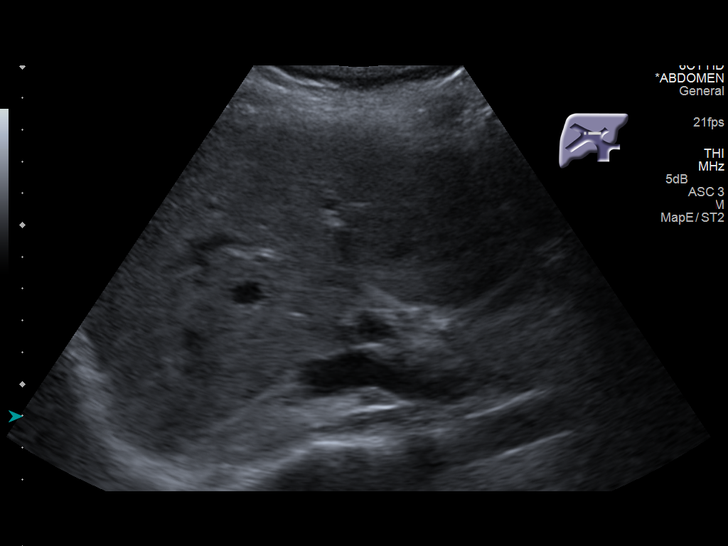
[im 34/73]
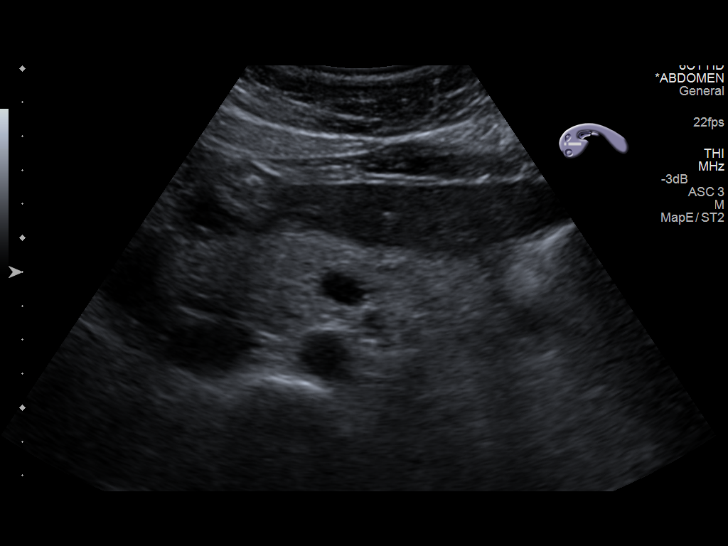
[im 40/73]
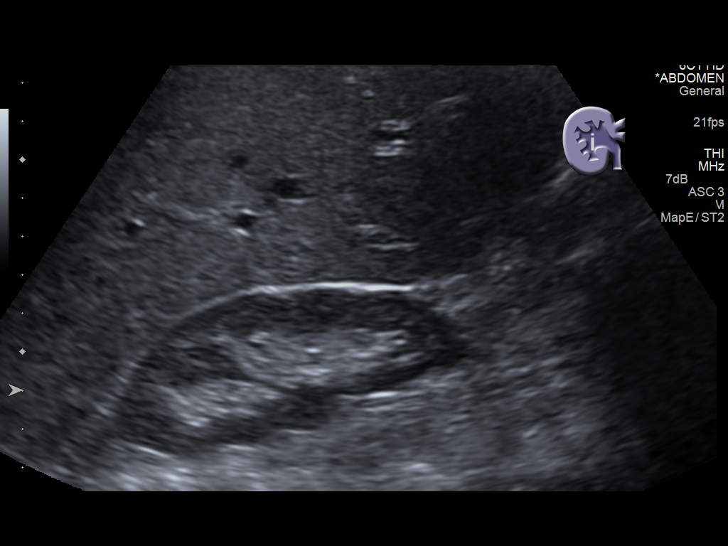
[im 46/73]
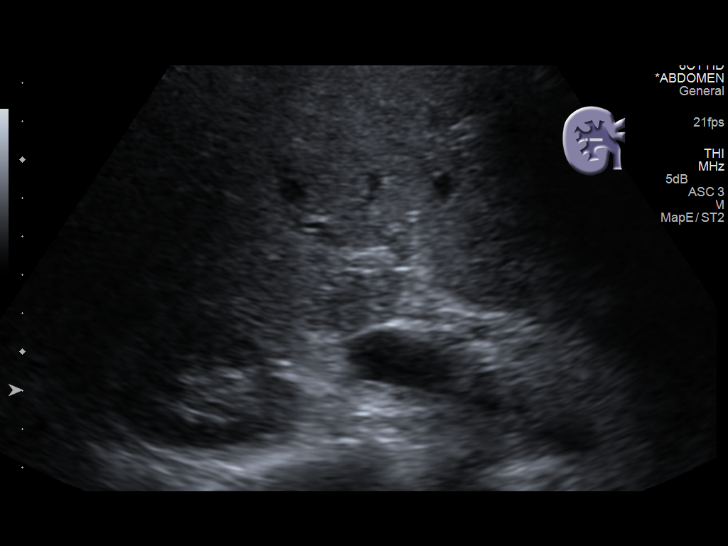
[im 49/73]
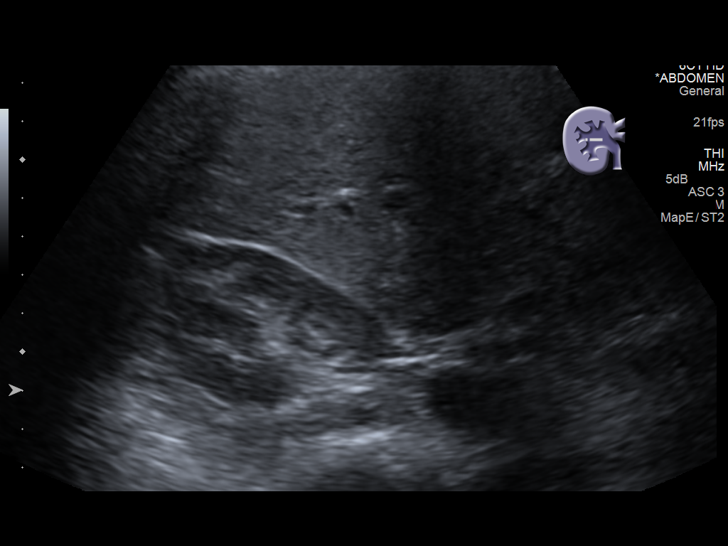
[im 55/73]
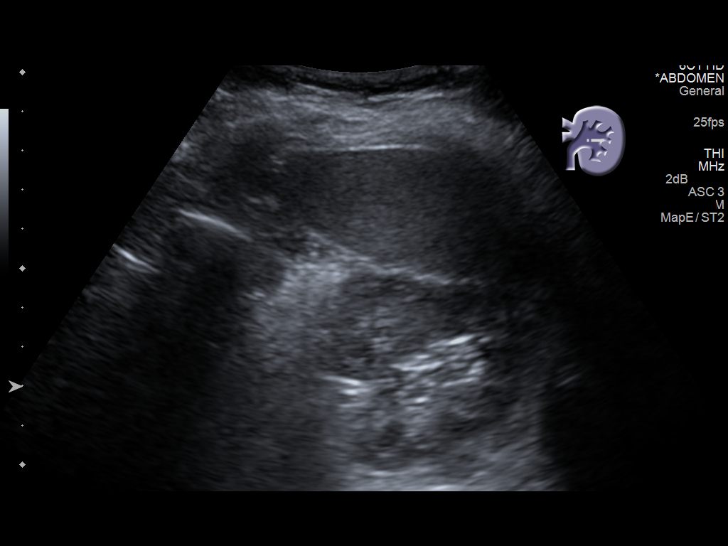
[im 61/73]
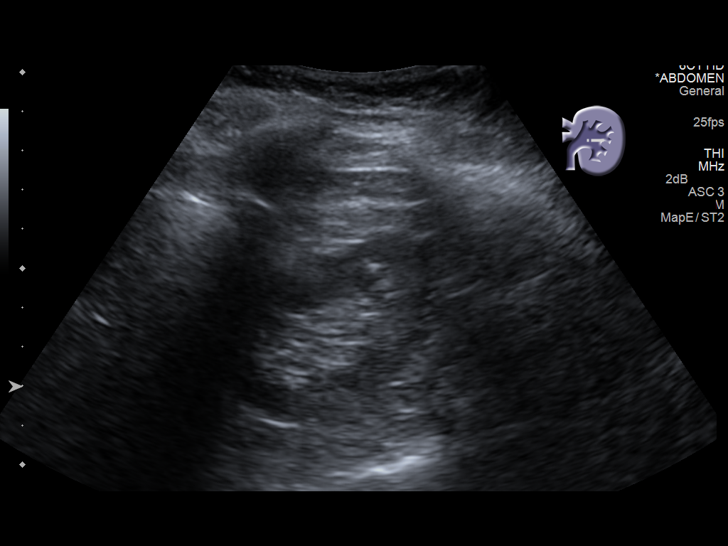
[im 67/73]
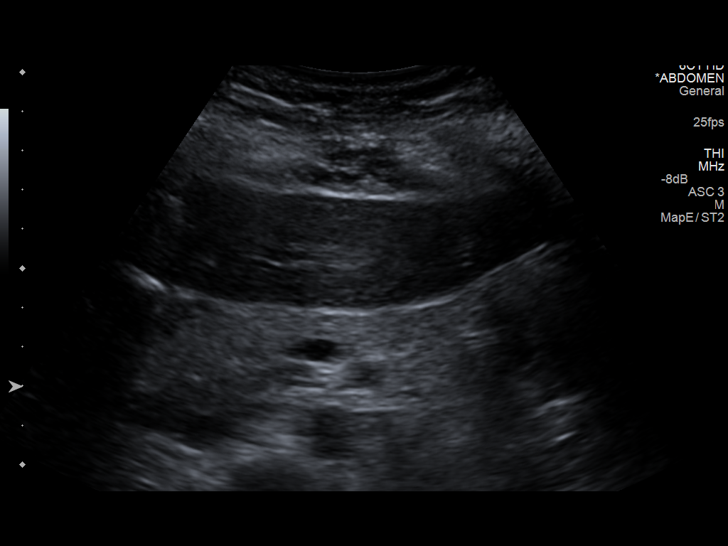
[im 73/73]
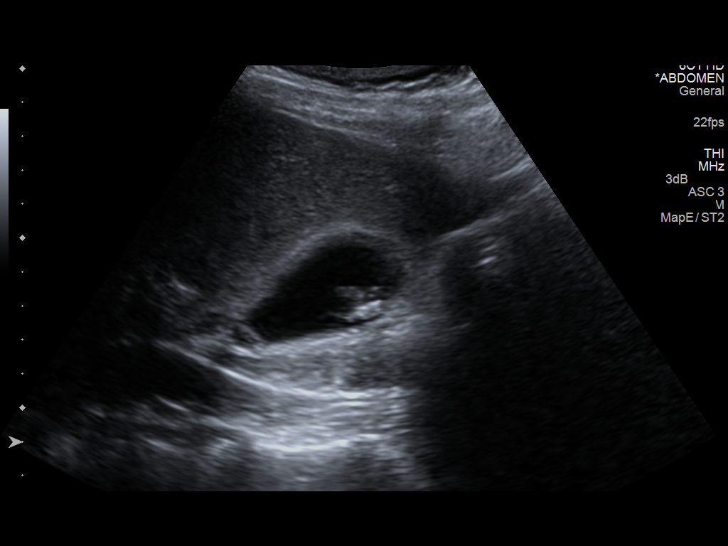

[14 of 25 positions shown; findings below may reference images not displayed]

FINDINGS: Gallbladder: Multiple mobile echogenic gallstones are demonstrated
within the gallbladder lumen. No gallbladder wall thickening or
pericholecystic fluid. Negative sonographic Murphy's sign.

Common bile duct: Diameter: 2.5 mm

Liver: No focal lesion identified. Within normal limits in
parenchymal echogenicity.

IVC: No abnormality visualized.

Pancreas: Visualized portion unremarkable.

Spleen: Size and appearance within normal limits.

Right Kidney: Length: 10.2 cm. Echogenicity within normal limits. No
mass or hydronephrosis visualized.

Left Kidney: Length: 11.2 cm. Echogenicity within normal limits. No
mass or hydronephrosis visualized.

Abdominal aorta: No aneurysm visualized.

Other findings: None.
IMPRESSION: Cholelithiasis without sonographic evidence for acute cholecystitis.

## 2017-01-05 IMAGING — RF DG CHOLANGIOGRAM OPERATIVE
1 series · 4 of 4 positions shown · non-contrast
Comparison: Abdominal ultrasound on 03/25/2015

CLINICAL DATA: Cholecystectomy for symptomatic cholelithiasis.

EXAM:
INTRAOPERATIVE CHOLANGIOGRAM
TECHNIQUE: Cholangiographic images from the C-arm fluoroscopic device were
submitted for interpretation post-operatively. Please see the
procedural report for the amount of contrast and the fluoroscopy
time utilized.

[Series 1: run · 4 of 62 frames shown]
[frame 10/62]
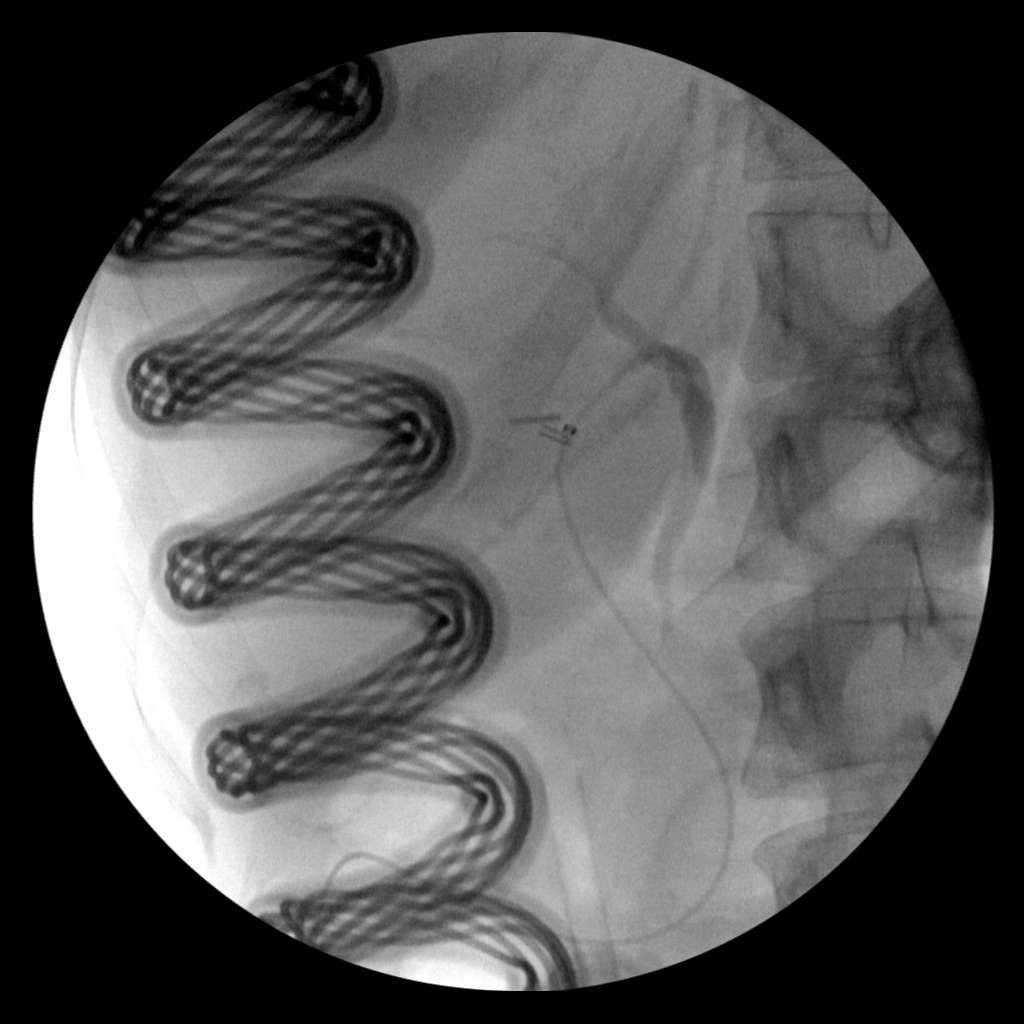
[frame 27/62]
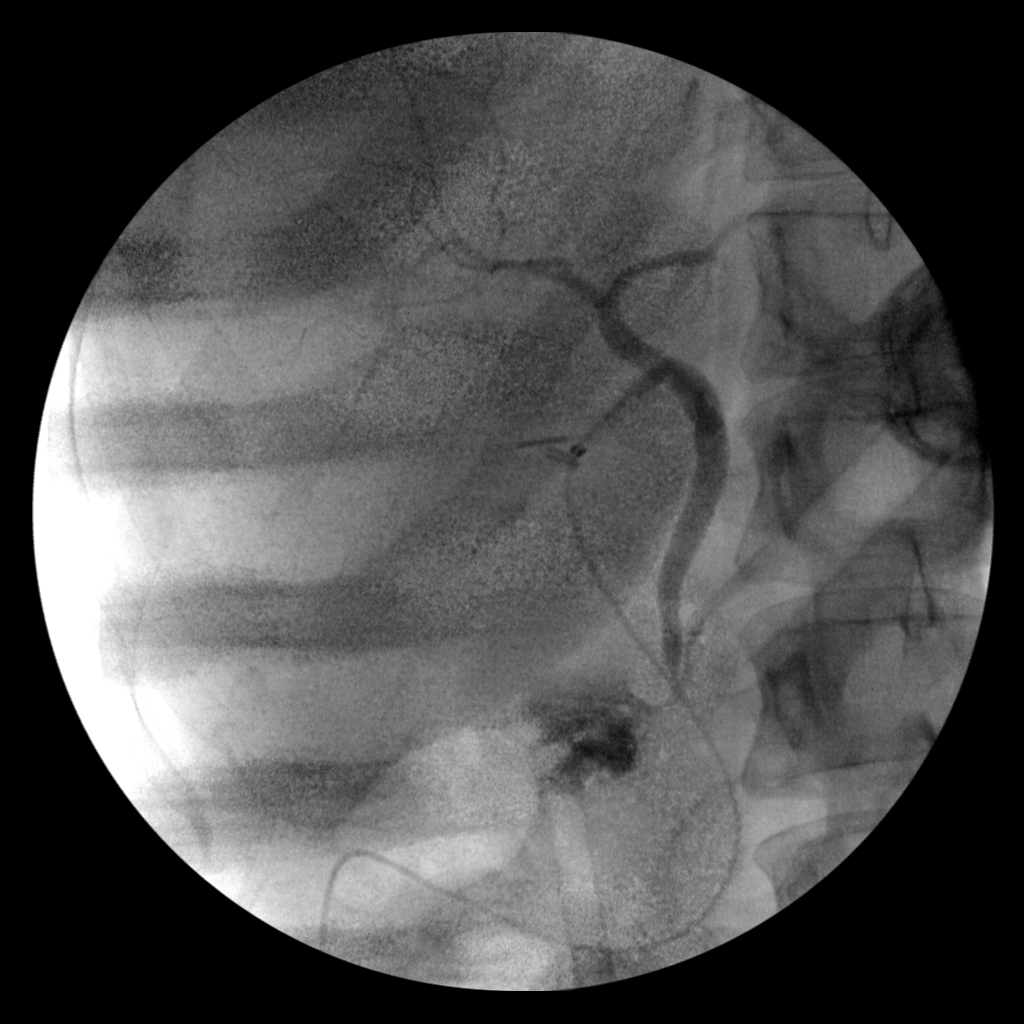
[frame 32/62]
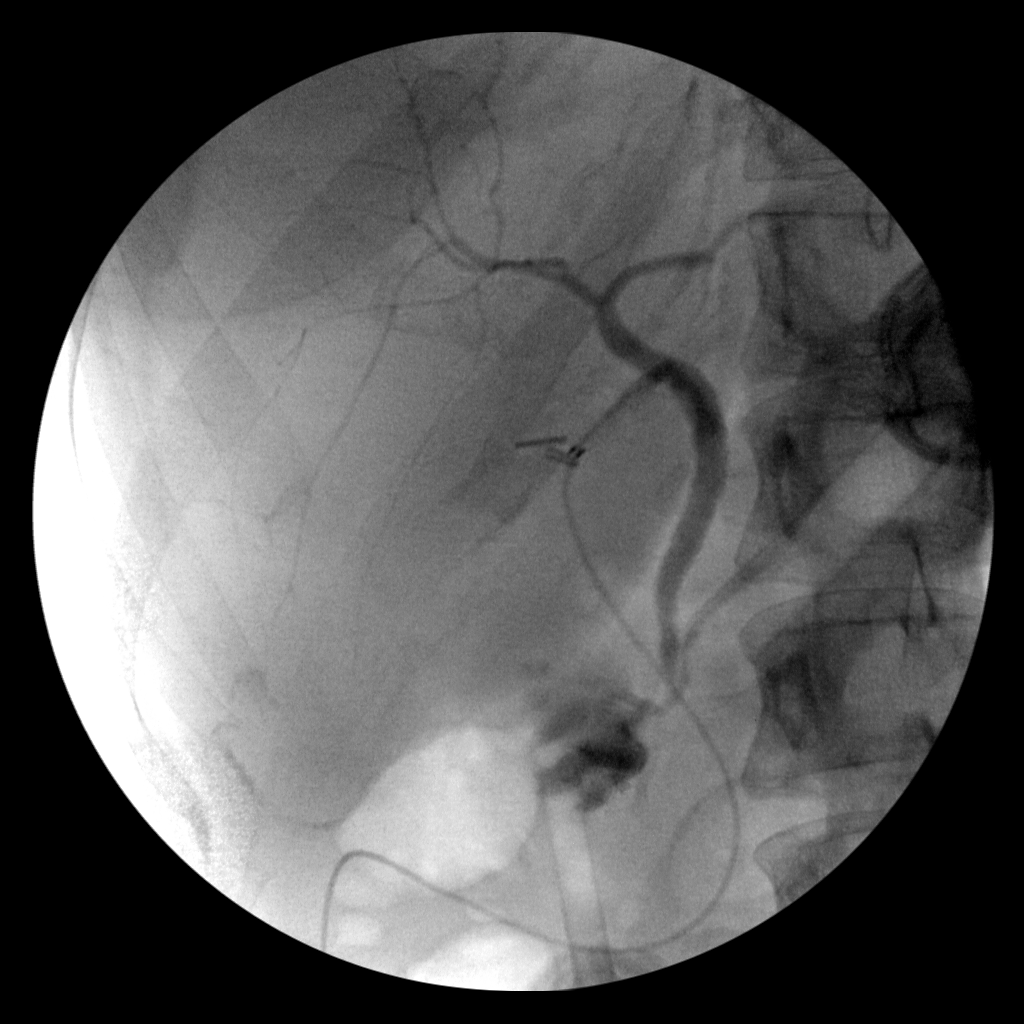
[frame 53/62]
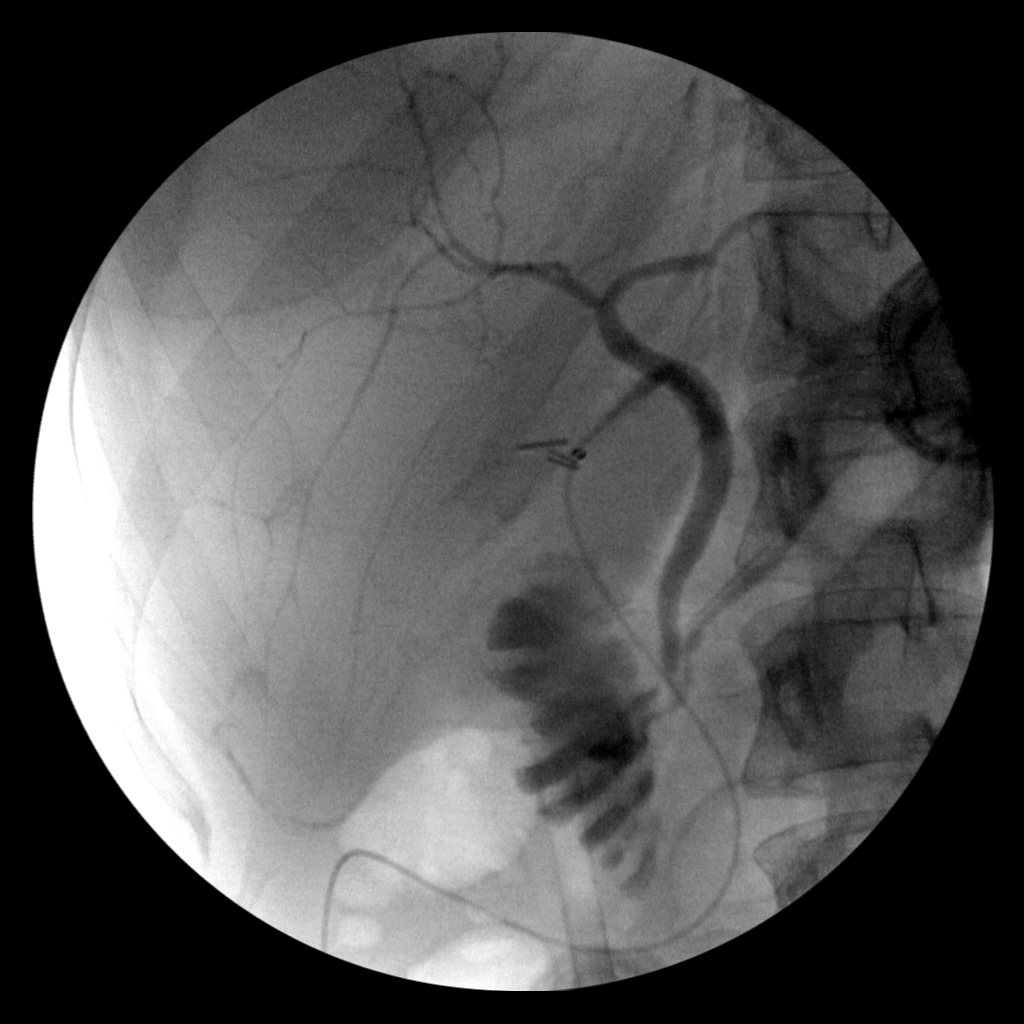

[4 of 4 positions shown; findings below may reference images not displayed]

FINDINGS: Intraoperative imaging obtained with a C-arm demonstrates a normal
opacified biliary tree without evidence of filling defects or
obstruction. Contrast enters the duodenum normally. There is mild
reflux of contrast into the pancreatic duct which appears
unremarkable.
IMPRESSION: Normal intraoperative cholangiogram.

## 2017-09-29 ENCOUNTER — Telehealth: Payer: Self-pay | Admitting: Allergy & Immunology

## 2017-09-29 MED ORDER — PREDNISONE 10 MG PO TABS: ORAL_TABLET | ORAL | 0 refills | Status: DC

## 2017-09-29 NOTE — Telephone Encounter (Signed)
Ms. Blanchett called due to 10 days of worsening urticaria. She has a concoction of antihistamines that she is using without much success. She has no systemic symptoms including shortness of breath. Therefore, I will send in a prednisone wean. As with previous attacks, she is unsure of the trigger.  Salvatore Marvel, MD [facility]

## 2020-03-15 ENCOUNTER — Ambulatory Visit: Payer: BC Managed Care – PPO | Attending: Internal Medicine

## 2020-03-15 DIAGNOSIS — Z23 Encounter for immunization: Secondary | ICD-10-CM

## 2020-03-15 NOTE — Progress Notes (Signed)
° °  Covid-19 Vaccination Clinic  Name:  Avital Dancy    MRN: [id number] DOB: 12/30/1980  03/15/2020  Ms. Sherman was observed post Covid-19 immunization for 15 minutes without incident. She was provided with Vaccine Information Sheet and instruction to access the V-Safe system.   Ms. Karim was instructed to call 911 with any severe reactions post vaccine:  Difficulty breathing   Swelling of face and throat   A fast heartbeat   A bad rash all over body   Dizziness and weakness

## 2020-04-20 ENCOUNTER — Ambulatory Visit: Payer: BC Managed Care – PPO | Attending: Surgery | Admitting: Physical Therapy

## 2020-04-20 ENCOUNTER — Other Ambulatory Visit: Payer: Self-pay

## 2020-04-20 DIAGNOSIS — R279 Unspecified lack of coordination: Secondary | ICD-10-CM | POA: Diagnosis present

## 2020-04-20 DIAGNOSIS — M6281 Muscle weakness (generalized): Secondary | ICD-10-CM | POA: Diagnosis present

## 2020-04-20 DIAGNOSIS — R252 Cramp and spasm: Secondary | ICD-10-CM | POA: Insufficient documentation

## 2020-04-20 NOTE — Therapy (Signed)
[facility] [address] [place], [place], [address] Phone: [phone]   Fax:  [phone]  Physical Therapy Evaluation  Patient Details  Name: Maria Berg MRN: [id number] Date of Birth: [birth year] Referring Provider (PT): Clovis Riley, MD   Encounter Date: 04/20/2020   PT End of Session - 04/20/20 1056    Visit Number 1    Date for PT Re-Evaluation 07/13/20    PT Start Time 1056    PT Stop Time 1135    PT Time Calculation (min) 39 min    Activity Tolerance Patient tolerated treatment well    Behavior During Therapy [facility] for tasks assessed/performed           Past Medical History:  Diagnosis Date  . Anemia    hx of   . Cancer ([place])    hx of bsal cell skin cancer  . Complication of anesthesia    shaky and emotional after anesthesia- hx of   . Headache(784.0)   . Hemorrhoids   . Hx of varicella   . Postpartum care following vaginal delivery (10/23/12) 10/24/2012  . Pregnancy induced hypertension   . Vacuum extraction, delivered, current hospitalization 10/24/2012    Past Surgical History:  Procedure Laterality Date  . BREAST SURGERY  2005   aug  . CHOLECYSTECTOMY N/A 05/16/2015   Procedure: LAPAROSCOPIC CHOLECYSTECTOMY WITH INTRAOPERATIVE CHOLANGIOGRAM;  Surgeon: Excell Seltzer, MD;  Location: [facility];  Service: General;  Laterality: N/A;  . DILATION AND CURETTAGE OF UTERUS     16 wk. w/triploidy  . HEMORRHOID SURGERY N/A 05/16/2015   Procedure: HEMORRHOIDECTOMY PROLAPSED;  Surgeon: Excell Seltzer, MD;  Location: [facility];  Service: General;  Laterality: N/A;  . skin cancer removal      05/11/2015 on back   . WISDOM TOOTH EXTRACTION      There were no vitals filed for this visit.    Subjective Assessment - 04/20/20 1102    Subjective Pt had 2 4th degree tears.  Prolapse occurs every time but out the "size of mandarin" 50% of the time.  Maria Berg              [facility] PT Assessment - 04/20/20  0001      Assessment   Medical Diagnosis K62.3 (ICD-10-CM) - Rectal prolapse    Referring Provider (PT) Clovis Riley, MD    Onset Date/Surgical Date --   3 years has gradually worsened   Prior Therapy n      Precautions   Precautions None      Balance Screen   Has the patient fallen in the past 6 months No      [place] residence    Living Arrangements Spouse/significant other;Children   5 and 7     Prior Function   Level of Independence Independent    Vocation Full time employment    Vocation Requirements sit at a desk      Cognition   Overall Cognitive Status Within Functional Limits for tasks assessed      Posture/Postural Control   Posture/Postural Control Postural limitations    Postural Limitations Rounded Shoulders;Increased thoracic kyphosis    Posture Comments Lt ilium anterior rotation      ROM / Strength   AROM / PROM / Strength AROM;Strength      Strength   Overall Strength Comments hip abduction Rt 4/5      Flexibility   Soft Tissue Assessment /Muscle Length yes  Hamstrings demo tension when doing fwd flexion                      Objective measurements completed on examination: See above findings.     Pelvic Floor Special Questions - 04/20/20 0001    Prior Pelvic/Prostate Exam Yes    Are you Pregnant or attempting pregnancy? No    Prior Pregnancies Yes    Number of Pregnancies 2    Number of Vaginal Deliveries 2    Any difficulty with labor and deliveries Yes    Episiotomy Performed Yes   2 x 4th deg   Currently Sexually Active Yes    Is this Painful No    Urinary Leakage No    Urinary urgency Yes    Fecal incontinence No    Falling out feeling (prolapse) Yes    Activities that cause feeling of prolapse BM    Skin Integrity Intact    External Palpation TTP Rt TP and levator    Prolapse --   rectal - not present currently   Pelvic Floor Internal Exam pt identity confirmed and internal  assessment consent to perform    Exam Type Rectal    Palpation flexed coccyx    Strength weak squeeze, no lift   1/5 external anal sphincter   Strength # of reps 2    Strength # of seconds 2    Tone moderately high; external sphincter low            [facility] Adult PT Treatment/Exercise - 04/20/20 0001      Self-Care   Self-Care Other Self-Care Comments    Other Self-Care Comments  intial HEP and toileting techniques educated and performed                  PT Education - 04/20/20 1146    Education Details Access Code: [email or web address]    Person(s) Educated Patient    Methods Explanation;Demonstration;Verbal cues;Handout;Tactile cues    Comprehension Verbalized understanding;Returned demonstration            PT Short Term Goals - 04/20/20 1431      PT SHORT TERM GOAL #1   Title ind with initial HEP and toileting techniques    Time 4    Period Weeks    Status New    Target Date 05/18/20             PT Long Term Goals - 04/20/20 1421      PT LONG TERM GOAL #1   Title Pt will report rectal prolapse reduced by 50%    Baseline "size of a mandarin orange"    Time 12    Period Weeks    Status New    Target Date 07/13/20      PT LONG TERM GOAL #2   Title Pt will be able to have a BM with about 50% less difficulty    Time 12    Period Weeks    Status New    Target Date 07/13/20      PT LONG TERM GOAL #3   Title pt will be ind with advanced HEP in order to maintain pelvic health    Time 12    Period Weeks    Status New    Target Date 07/13/20                  Plan - 04/20/20 1139    Clinical Impression Statement Pt presents to skilled PT  due to rectal prolapse that has been worsening ove the last 3 years. Pt has some posture abnormalities and mild core strength deficits.  PT assessed pelvic floor rectally today which reveals some elevated tone and difficulty relaxing.  Pt has increased flexion of the coccyx which may indicate shortened pubococcygeus.  Pt  also has weakened anal sphincter muscles with 2/5 MMT due to already high tone.  Pt external anal sphincter only 1/5.  Pt also has a hard time isolating the pelvic floor without using the gluteal muscles.  Pt has tight hamstrings as demonstrated with fwd flexion.  Pt has Lt ilium rotation anteriorly.  Pt will benefit from skilled PT to address impairments and reduce rectal prolapse and hopefully avoid need for future surgery    Personal Factors and Comorbidities Comorbidity 3+    Comorbidities 4th deg perineal tears x 2; rectal prolapse corrective surgery; gallbaldder surgery    Examination-Activity Limitations Toileting    Stability/Clinical Decision Making Evolving/Moderate complexity    Clinical Decision Making Low    Rehab Potential Excellent    PT Frequency 1x / week    PT Duration 12 weeks    PT Treatment/Interventions ADLs/Self Care Home Management;Biofeedback;Cryotherapy;Electrical Stimulation;Moist Heat;Neuromuscular re-education;Therapeutic exercise;Therapeutic activities;Patient/family education;Manual techniques;Dry needling;Passive range of motion;Taping    PT Next Visit Plan stretch and relax pelvic floor; exercise to isolate pelvic floor; Lt ilium correction    PT Home Exercise Plan Access Code: [email or web address]    QMVHQIONG and Agree with Plan of Care Patient           Patient will benefit from skilled therapeutic intervention in order to improve the following deficits and impairments:  Decreased coordination,Decreased strength,Increased fascial restricitons,Impaired flexibility,Impaired tone,Increased muscle spasms  Visit Diagnosis: Cramp and spasm  Unspecified lack of coordination  Muscle weakness (generalized)     Problem List Patient Active Problem List   Diagnosis Date Noted  . Idiopathic urticaria 04/19/2016  . Postpartum care following vaginal delivery (4/11) 08/16/2014  . Indication for care in labor or delivery 08/15/2014    Jule Ser, PT 04/20/2020,  2:36 PM  [facility] [address] [place], [place], [address] Phone: [phone]   Fax:  [phone]  Name: Jonita Hirota MRN: [id number] Date of Birth: [birth year]

## 2020-04-20 NOTE — Patient Instructions (Addendum)
Toileting Techniques for Bowel Movements (Defecation) Using your belly (abdomen) and pelvic floor muscles to have a bowel movement is usually instinctive.  Sometimes people can have problems with these muscles and have to relearn proper defecation (emptying) techniques.  If you have weakness in your muscles, organs that are falling out, decreased sensation in your pelvis, or ignore your urge to go, you may find yourself straining to have a bowel movement.  You are straining if you are:  holding your breath or taking in a huge gulp of air and holding it   keeping your lips and jaw tensed and closed tightly  turning red in the face because of excessive pushing or forcing  developing or worsening your  hemorrhoids  getting faint while pushing  not emptying completely and have to defecate many times a day  If you are straining, you are actually making it harder for yourself to have a bowel movement.  Many people find they are pulling up with the pelvic floor muscles and closing off instead of opening the anus. Due to lack pelvic floor relaxation and coordination the abdominal muscles, one has to work harder to push the feces out.  Many people have never been taught how to defecate efficiently and effectively.  Notice what happens to your body when you are having a bowel movement.  While you are sitting on the toilet pay attention to the following areas:  Jaw and mouth position  Angle of your hips    Whether your feet touch the ground or not  Arm placement   Spine position  Waist  Belly tension  Anus (opening of the anal canal)  An Evacuation/Defecation Plan   Here are the 4 basic points:  1. Lean forward enough for your elbows to rest on your knees 2. Support your feet on the floor or use a low stool if your feet dont touch the floor  3. Push out your belly as if you have swallowed a beach ball--you should feel a widening of your waist 4. Open and relax your pelvic floor  muscles, rather than tightening around the anus      The following conditions my require modifications to your toileting posture:   If you have had surgery in the past that limits your back, hip, pelvic, knee or ankle flexibility  Constipation   Your healthcare practitioner may make the following additional suggestions and adjustments:  1) Sit on the toilet  a) Make sure your feet are supported. b) Notice your hip angle and spine position--most people find it effective to lean forward or raise their knees, which can help the muscles around the anus to relax  c) When you lean forward, place your forearms on your thighs for support  2) Relax suggestions a) Breath deeply in through your nose and out slowly through your mouth as if you are smelling the flowers and blowing out the candles. b) To become aware of how to relax your muscles, contracting and releasing muscles can be helpful.  Pull your pelvic floor muscles in tightly by using the image of holding back gas, or closing around the anus (visualize making a circle smaller) and lifting the anus up and in.  Then release the muscles and your anus should drop down and feel open. Repeat 5 times ending with the feeling of relaxation. c) Keep your pelvic floor muscles relaxed; let your belly bulge out. d) The digestive tract starts at the mouth and ends at the anal opening, so be  sure to relax both ends of the tube.  Place your tongue on the roof of your mouth with your teeth separated.  This helps relax your mouth and will help to relax the anus at the same time.  3) Empty (defecation) a) Keep your pelvic floor and sphincter relaxed, then bulge your anal muscles.  Make the anal opening wide.  b) Stick your belly out as if you have swallowed a beach ball. c) Make your belly wall hard using your belly muscles while continuing to breathe. Doing this makes it easier to open your anus. d) Breath out and give a grunt (or try using other sounds  such as ahhhh, shhhhh, ohhhh or grrrrrrr).  4) Finish a) As you finish your bowel movement, pull the pelvic floor muscles up and in.  This will leave your anus in the proper place rather than remaining pushed out and down. If you leave your anus pushed out and down, it will start to feel as though that is normal and give you incorrect signals about needing to have a bowel movement.    [facility] [place], [place] [address] Access Code: [email or web address] URL: [email or web address] Date: 04/20/2020 Prepared by: Jari Favre  Exercises Supine Diaphragmatic Breathing - 3 x daily - 7 x weekly - 1 sets - 10 reps Supine Pelvic Floor Stretch - 1 x daily - 7 x weekly - 1 sets - 3 reps - 30 sec hold Supine Pelvic Floor Stretch - 1 x daily - 7 x weekly - 1 sets - 3 reps - 30 sec hold

## 2020-05-12 ENCOUNTER — Encounter: Payer: BC Managed Care – PPO | Admitting: Physical Therapy

## 2020-05-17 ENCOUNTER — Encounter: Payer: Self-pay | Admitting: Physical Therapy

## 2020-05-17 ENCOUNTER — Other Ambulatory Visit: Payer: Self-pay

## 2020-05-17 ENCOUNTER — Ambulatory Visit: Payer: BC Managed Care – PPO | Attending: Surgery | Admitting: Physical Therapy

## 2020-05-17 DIAGNOSIS — R252 Cramp and spasm: Secondary | ICD-10-CM | POA: Insufficient documentation

## 2020-05-17 DIAGNOSIS — M6281 Muscle weakness (generalized): Secondary | ICD-10-CM | POA: Diagnosis present

## 2020-05-17 DIAGNOSIS — R279 Unspecified lack of coordination: Secondary | ICD-10-CM | POA: Diagnosis present

## 2020-05-17 NOTE — Therapy (Addendum)
[facility] [address], [place], [place], [address] Phone: [phone]   Fax:  [phone]  Physical Therapy Treatment  Patient Details  Name: Maria Berg MRN: [id number] Date of Birth: 1980/09/21 Referring Provider (PT): Clovis Riley, MD   Encounter Date: 05/17/2020   PT End of Session - 05/17/20 1758    Visit Number 2    Date for PT Re-Evaluation 07/13/20    PT Start Time 1620    PT Stop Time 1700    PT Time Calculation (min) 40 min    Activity Tolerance Patient tolerated treatment well    Behavior During Therapy [facility] for tasks assessed/performed           Past Medical History:  Diagnosis Date  . Anemia    hx of   . Cancer ([place])    hx of bsal cell skin cancer  . Complication of anesthesia    shaky and emotional after anesthesia- hx of   . Headache(784.0)   . Hemorrhoids   . Hx of varicella   . Postpartum care following vaginal delivery (10/23/12) 10/24/2012  . Pregnancy induced hypertension   . Vacuum extraction, delivered, current hospitalization 10/24/2012    Past Surgical History:  Procedure Laterality Date  . BREAST SURGERY  2005   aug  . CHOLECYSTECTOMY N/A 05/16/2015   Procedure: LAPAROSCOPIC CHOLECYSTECTOMY WITH INTRAOPERATIVE CHOLANGIOGRAM;  Surgeon: Excell Seltzer, MD;  Location: [facility];  Service: General;  Laterality: N/A;  . DILATION AND CURETTAGE OF UTERUS     16 wk. w/triploidy  . HEMORRHOID SURGERY N/A 05/16/2015   Procedure: HEMORRHOIDECTOMY PROLAPSED;  Surgeon: Excell Seltzer, MD;  Location: [facility];  Service: General;  Laterality: N/A;  . skin cancer removal      05/11/2015 on back   . WISDOM TOOTH EXTRACTION      There were no vitals filed for this visit.   Subjective Assessment - 05/17/20 1758    Subjective toileting is easier with squatty potty and have been doing the breathing    Currently in Pain? No/denies                             [facility]  Adult PT Treatment/Exercise - 05/17/20 0001      Neuro Re-ed    Neuro Re-ed Details  coordination and pelvic alignment during ex's      Lumbar Exercises: Standing   Lifting Limitations hip hinge technique - cane and hand on pelvis    Other Standing Lumbar Exercises lean at table with core and pelvic contraction isolating with toe in      Lumbar Exercises: Supine   Clam 10 reps    Clam Limitations red      Lumbar Exercises: Quadruped   [place]/Old Horse 10 reps    Other Quadruped Lumbar Exercises circles with cue to keep back flat      Manual Therapy   Manual Therapy Soft tissue mobilization    Soft tissue mobilization glteals and lumbar paraspinlas bil                    PT Short Term Goals - 04/20/20 1431      PT SHORT TERM GOAL #1   Title ind with initial HEP and toileting techniques    Time 4    Period Weeks    Status New    Target Date 05/18/20  PT Long Term Goals - 04/20/20 1421      PT LONG TERM GOAL #1   Title Pt will report rectal prolapse reduced by 50%    Baseline "size of a mandarin orange"    Time 12    Period Weeks    Status New    Target Date 07/13/20      PT LONG TERM GOAL #2   Title Pt will be able to have a BM with about 50% less difficulty    Time 12    Period Weeks    Status New    Target Date 07/13/20      PT LONG TERM GOAL #3   Title pt will be ind with advanced HEP in order to maintain pelvic health    Time 12    Period Weeks    Status New    Target Date 07/13/20                 Plan - 05/17/20 1715    Clinical Impression Statement Today's session focused on posture, body mechanics and basic pelvic floor strengthening with techniques to isolate the pelvic floor.  Pt did well demosntrating fatigue and improved hip hinging at the end of the session today.  Pt has also made progress withthe use of toileting techniques. Pt will benefit from skilled PT to continue to work on pelvic floor coordination, address  core strength, and body mechanics with neutral pelvic alignment    PT Treatment/Interventions ADLs/Self Care Home Management;Biofeedback;Cryotherapy;Electrical Stimulation;Moist Heat;Neuromuscular re-education;Therapeutic exercise;Therapeutic activities;Patient/family education;Manual techniques;Dry needling;Passive range of motion;Taping    PT Next Visit Plan f/u on exercise to isolate pelvic floor, prone press up, h/s and quad stretch    PT Home Exercise Plan Access Code: [email or web address]    Consulted and Agree with Plan of Care Patient           Patient will benefit from skilled therapeutic intervention in order to improve the following deficits and impairments:  Decreased coordination,Decreased strength,Increased fascial restricitons,Impaired flexibility,Impaired tone,Increased muscle spasms  Visit Diagnosis: Cramp and spasm  Unspecified lack of coordination  Muscle weakness (generalized)     Problem List Patient Active Problem List   Diagnosis Date Noted  . Idiopathic urticaria 04/19/2016  . Postpartum care following vaginal delivery (4/11) 08/16/2014  . Indication for care in labor or delivery 08/15/2014    Jule Ser, PT 05/17/2020, 5:59 PM  [facility] [address] [place], [place], [address] Phone: [phone]   Fax:  [phone]  Name: Maria Berg MRN: [id number] Date of Birth: 19-Oct-1980  PHYSICAL THERAPY DISCHARGE SUMMARY  Visits from Start of Care: 2  Current functional level related to goals / functional outcomes: See above goals   Remaining deficits: See above   Education / Equipment: HEP  Plan: Patient agrees to discharge.  Patient goals were not met. Patient is being discharged due to a change in medical status.  ?????    Pt called and let Maria Berg know she is planning to get surgery  Gustavus Bryant, PT 06/07/20 3:22 PM

## 2020-05-24 ENCOUNTER — Encounter: Payer: BC Managed Care – PPO | Admitting: Physical Therapy

## 2020-06-02 ENCOUNTER — Ambulatory Visit: Payer: BC Managed Care – PPO | Admitting: Physical Therapy

## 2020-06-23 ENCOUNTER — Encounter: Payer: BC Managed Care – PPO | Admitting: Physical Therapy

## 2020-06-30 ENCOUNTER — Encounter: Payer: BC Managed Care – PPO | Admitting: Physical Therapy

## 2021-02-03 ENCOUNTER — Ambulatory Visit (INDEPENDENT_AMBULATORY_CARE_PROVIDER_SITE_OTHER): Payer: BC Managed Care – PPO | Admitting: Psychiatry

## 2021-02-03 ENCOUNTER — Encounter: Payer: Self-pay | Admitting: Psychiatry

## 2021-02-03 VITALS — BP 136/95 | HR 104 | Ht 67.0 in | Wt 155.0 lb

## 2021-02-03 DIAGNOSIS — G43009 Migraine without aura, not intractable, without status migrainosus: Secondary | ICD-10-CM

## 2021-02-03 DIAGNOSIS — G43909 Migraine, unspecified, not intractable, without status migrainosus: Secondary | ICD-10-CM | POA: Insufficient documentation

## 2021-02-03 MED ORDER — SUMATRIPTAN SUCCINATE 50 MG PO TABS: 50.0000 mg | ORAL_TABLET | Freq: Two times a day (BID) | ORAL | 3 refills | Status: AC | PRN

## 2021-02-03 MED ORDER — QULIPTA 60 MG PO TABS: 1.0000 | ORAL_TABLET | Freq: Every day | ORAL | 2 refills | Status: DC

## 2021-02-03 NOTE — Patient Instructions (Addendum)
[email or web address] can provide financial assistance if you run into issues with insurance coverage of Qulipta Consider Botox, Ajovy, or Aimovig if migraines worsen

## 2021-02-03 NOTE — Progress Notes (Signed)
Referring:  Mayra Neer, MD [facility] [employer] [address],  [place] [address]  PCP: Mayra Neer, MD  Neurology was asked to evaluate Laretta Alstrom, a 40 year old female for a chief complaint of headaches.  Our recommendations of care will be communicated by shared medical record.    CC:  headaches  HPI:  Medical co-morbidities: migraines, anxiety  The patient presents for evaluation of migraines since she was 40 years old. In college she would get them 3-5 days per month and they have increased since she had children. Previously she was having headaches every day and she was overusing Imitrex.   Started Qulipta 6 weeks ago which has improved her headaches significantly. She has had ~4 headaches since starting it and only required Imitrex 3 times. Does have heart racing with Imitrex, but this has worked better than other triptans or Nurtec.  Has tried most migraine preventatives other than Botox or topamax. Sister took Topamax and had bad brain fog as a side effect.  Headache History: Onset: 40 years old Triggers: social and physical activity Most common time of day for headache to begin: morning, bedtime Aura: once - visual aura Location: left frontal Quality/Description: sharp Associated Symptoms:  Photophobia: yes  Phonophobia: yes  Nausea: yes Vomiting: yes Worse with activity?: yes Duration of headaches: can last up to 2 weeks without treatment, about one hour with Imitrex  Pregnancy planning/birth control: nuvaring  Headache days per month: 3 Headache free days per month: 27  Current Treatment: Abortive Imitrex 50-100 mg PRN  Preventative Qulipta 60 mg  Prior Therapies                                 Duration of Use           Dose                          Side effect Imitrex 50-100 mg PRN Frovatriptan 205 mg PRN Sertraline 50 mg daily Zyprexa 2.5 mg daily Qulipta 60 mg daily Propranolol 20 mg  BID Amitriptyline Emgality Nurtec Phenergan accupuncture  Headache Risk Factors: Headache risk factors and/or co-morbidities (-) Neck Pain (-) History of Motor Vehicle Accident (-) History of Traumatic Brain Injury and/or Concussion  LABS: N/a  IMAGING:  Patient reports normal head imaging in the past, no images available for review today   Current Outpatient Medications on File Prior to Visit  Medication Sig Dispense Refill   amphetamine-dextroamphetamine (ADDERALL XR) 20 MG 24 hr capsule Take 20 mg by mouth daily.     BIOTIN PO Take 1 tablet by mouth daily.     ibuprofen (ADVIL,MOTRIN) 200 MG tablet Take 600 mg by mouth every 6 (six) hours as needed for headache, mild pain or moderate pain.      NUVARING 0.12-0.015 MG/24HR vaginal ring SMARTSIG:Ring Vaginal Every 4 Weeks     OLANZapine (ZYPREXA) 2.5 MG tablet Take 2.5 mg by mouth daily.     promethazine (PHENERGAN) 25 MG tablet Take 1 tablet (25 mg total) by mouth every 6 (six) hours as needed for nausea. 15 tablet 0   QULIPTA 60 MG TABS Take 1 tablet by mouth daily.     SUMAtriptan (IMITREX) 50 MG tablet Take 50 mg by mouth 2 (two) times daily as needed for migraine. May repeat in 2 hours if migraine persist.     montelukast (SINGULAIR) 10  MG tablet Take 1 tablet (10 mg total) by mouth at bedtime. (Patient not taking: Reported on 02/03/2021) 30 tablet 5   ranitidine (ZANTAC) 150 MG tablet Take 1 tablet (150 mg total) by mouth 2 (two) times daily. (Patient not taking: Reported on 02/03/2021) 60 tablet 5   sertraline (ZOLOFT) 50 MG tablet  (Patient not taking: Reported on 02/03/2021)  5   No current facility-administered medications on file prior to visit.     Allergies: Allergies  Allergen Reactions   Sulfa Antibiotics Other (See Comments)    Never exposed Sister had live-threatening allergy to sulfa drug    Family History: Migraine or other headaches in the family:  daughter, possibly paternal grandmother Aneurysms in  a first degree relative:  no Brain tumors in the family:  no Other neurological illness in the family:   no  Past Medical History: Past Medical History:  Diagnosis Date   Anemia    hx of    Anxiety    Cancer ([place])    hx of bsal cell skin cancer   Complication of anesthesia    shaky and emotional after anesthesia- hx of    Headache(784.0)    Hemorrhoids    Hx of varicella    OCD (obsessive compulsive disorder)    Postpartum care following vaginal delivery (10/23/12) 10/24/2012   Pregnancy induced hypertension    Vacuum extraction, delivered, current hospitalization 10/24/2012   Vertigo     Past Surgical History Past Surgical History:  Procedure Laterality Date   BREAST SURGERY  2005   aug   CHOLECYSTECTOMY N/A 05/16/2015   Procedure: LAPAROSCOPIC CHOLECYSTECTOMY WITH INTRAOPERATIVE CHOLANGIOGRAM;  Surgeon: Excell Seltzer, MD;  Location: [facility];  Service: General;  Laterality: N/A;   DILATION AND CURETTAGE OF UTERUS     16 wk. w/triploidy   HEMORRHOID SURGERY N/A 05/16/2015   Procedure: HEMORRHOIDECTOMY PROLAPSED;  Surgeon: Excell Seltzer, MD;  Location: [facility];  Service: General;  Laterality: N/A;   skin cancer removal      05/11/2015 on back    WISDOM TOOTH EXTRACTION      Social History: Social History   Tobacco Use   Smoking status: Never   Smokeless tobacco: Never  Vaping Use   Vaping Use: Never used  Substance Use Topics   Alcohol use: Yes    Alcohol/week: 2.0 standard drinks    Types: 2 Standard drinks or equivalent per week   Drug use: No    ROS: Negative for fevers, chills. Positive for headaches. All other systems reviewed and negative unless stated otherwise in HPI.   Physical Exam:   Vital Signs: BP (!) 136/95 (BP Location: Left Arm, Patient Position: Sitting) Comment: pt nervous  Pulse (!) 104   Ht 5\' 7"  (1.702 m)   Wt 155 lb (70.3 kg)   Breastfeeding No Comment: has a nuvaring, little bit of a period  BMI 24.28 kg/m  GENERAL: well  appearing,in no acute distress,alert SKIN:  Color, texture, turgor normal. No rashes or lesions HEAD:  Normocephalic/atraumatic. CV:  RRR RESP: Normal respiratory effort MSK: no tenderness to palpation over occiput, neck, or shoulders  NEUROLOGICAL: Mental Status: Alert, oriented to person, place and time,Follows commands Cranial Nerves: PERRL,visual fields intact to confrontation,extraocular movements intact,facial sensation intact,no facial droop or ptosis,hearing intact to finger rub bilaterally,no dysarthria,palate elevate symmetrically,tongue protrudes midline,shoulder shrug intact and symmetric Motor: muscle strength 5/5 both upper and lower extremities,no drift, normal tone Reflexes: 2+ throughout Sensation: intact to light touch all 4 extremities Coordination:  Finger-to- nose-finger intact bilaterally,Heel-to-shin intact bilaterally Gait: normal-based   IMPRESSION: 40 year old female with a history of migraines and anxiety who presents for evaluation of migraines. She has had significant improvement in her headaches since starting Qulipta for prevention. Imitrex resolves her breakthrough migraines within an hour. Will continue current regimen for now.  PLAN: -Preventive: Qulipta 60 mg daily -Abortive: Imitrex 50-100 mg PRN -Next steps: consider Botox, Ajovy, or Aimovig if migraines worsen   Headache education was done. Discussed treatment options including preventive and acute medications, natural supplements, and physical therapy. Discussed medication overuse headache and to limit use of acute treatments to no more than 2 days/week or 10 days/month. Discussed medication side effects, adverse reactions and drug interactions. Written educational materials and patient instructions outlining all of the above were given.  Follow-up: 3-6 months   Genia Harold, MD 02/03/2021   8:24 AM

## 2021-05-11 ENCOUNTER — Other Ambulatory Visit: Payer: Self-pay | Admitting: Psychiatry

## 2021-08-10 ENCOUNTER — Other Ambulatory Visit: Payer: Self-pay

## 2021-08-10 MED ORDER — QULIPTA 60 MG PO TABS: 1.0000 | ORAL_TABLET | Freq: Every day | ORAL | 2 refills | Status: AC

## 2021-10-31 ENCOUNTER — Other Ambulatory Visit: Payer: Self-pay | Admitting: Psychiatry

## 2022-06-30 ENCOUNTER — Emergency Department (HOSPITAL_COMMUNITY): Payer: BC Managed Care – PPO

## 2022-06-30 ENCOUNTER — Emergency Department (HOSPITAL_COMMUNITY)
Admission: EM | Admit: 2022-06-30 | Discharge: 2022-06-30 | Disposition: A | Discharge status: Home / Self Care | Payer: BC Managed Care – PPO | Attending: Emergency Medicine | Admitting: Emergency Medicine

## 2022-06-30 ENCOUNTER — Encounter (HOSPITAL_COMMUNITY): Payer: Self-pay | Admitting: Emergency Medicine

## 2022-06-30 ENCOUNTER — Other Ambulatory Visit: Payer: Self-pay

## 2022-06-30 DIAGNOSIS — Y9301 Activity, walking, marching and hiking: Secondary | ICD-10-CM | POA: Insufficient documentation

## 2022-06-30 DIAGNOSIS — S82852A Displaced trimalleolar fracture of left lower leg, initial encounter for closed fracture: Secondary | ICD-10-CM | POA: Diagnosis not present

## 2022-06-30 DIAGNOSIS — W010XXA Fall on same level from slipping, tripping and stumbling without subsequent striking against object, initial encounter: Secondary | ICD-10-CM | POA: Diagnosis not present

## 2022-06-30 DIAGNOSIS — S99912A Unspecified injury of left ankle, initial encounter: Secondary | ICD-10-CM | POA: Diagnosis present

## 2022-06-30 DIAGNOSIS — W19XXXA Unspecified fall, initial encounter: Secondary | ICD-10-CM

## 2022-06-30 MED ORDER — PROPOFOL 10 MG/ML IV BOLUS
INTRAVENOUS | Status: AC | PRN
  Administered 2022-06-30: 10 mg via INTRAVENOUS
  Administered 2022-06-30: 20 mg via INTRAVENOUS

## 2022-06-30 MED ORDER — LORAZEPAM 2 MG/ML IJ SOLN
0.5000 mg | Freq: Once | INTRAMUSCULAR | Status: AC
  Administered 2022-06-30: 0.5 mg via INTRAVENOUS
  Filled 2022-06-30: qty 1

## 2022-06-30 MED ORDER — OXYCODONE-ACETAMINOPHEN 5-325 MG PO TABS
1.0000 | ORAL_TABLET | Freq: Once | ORAL | Status: AC
  Administered 2022-06-30: 1 via ORAL
  Filled 2022-06-30: qty 1

## 2022-06-30 MED ORDER — SODIUM CHLORIDE 0.9 % IV SOLN
INTRAVENOUS | Status: AC | PRN
  Administered 2022-06-30: 1000 mL via INTRAVENOUS

## 2022-06-30 MED ORDER — ONDANSETRON 4 MG PO TBDP: 4.0000 mg | ORAL_TABLET | Freq: Three times a day (TID) | ORAL | 0 refills | Status: DC | PRN

## 2022-06-30 MED ORDER — ONDANSETRON HCL 4 MG/2ML IJ SOLN
4.0000 mg | Freq: Once | INTRAMUSCULAR | Status: AC
  Administered 2022-06-30: 4 mg via INTRAVENOUS
  Filled 2022-06-30: qty 2

## 2022-06-30 MED ORDER — OXYCODONE-ACETAMINOPHEN 5-325 MG PO TABS: 1.0000 | ORAL_TABLET | Freq: Four times a day (QID) | ORAL | 0 refills | Status: DC | PRN

## 2022-06-30 MED ORDER — HYDROMORPHONE HCL 1 MG/ML IJ SOLN
INTRAMUSCULAR | Status: AC
  Filled 2022-06-30: qty 1

## 2022-06-30 MED ORDER — PROPOFOL 10 MG/ML IV BOLUS
0.5000 mg/kg | Freq: Once | INTRAVENOUS | Status: AC
  Administered 2022-06-30: 50 mg via INTRAVENOUS
  Filled 2022-06-30: qty 20

## 2022-06-30 MED ORDER — HYDROMORPHONE HCL 1 MG/ML IJ SOLN
1.0000 mg | Freq: Once | INTRAMUSCULAR | Status: AC
  Administered 2022-06-30: 1 mg via INTRAVENOUS

## 2022-06-30 NOTE — Discharge Instructions (Addendum)
Do not put weight on your left leg.  Keep your leg elevated.  Call Monday morning for close orthopedics follow up.

## 2022-06-30 NOTE — ED Provider Notes (Signed)
[facility] Provider Note   CSN: [id number] Arrival date & time: 06/30/22  0028     History  Chief Complaint  Patient presents with   Ankle Injury    Maria Berg is a 42 y.o. female.  The history is provided by the patient.  Ankle Pain Ankle Injury  Maria Berg is a 42 y.o. female who presents to the Emergency Department complaining of ankle injury.  She presents to the emergency department for evaluation of left ankle injury that happened just prior to ED arrival.  She states that she was walking and slippers in the wet grass and slipped and felt a crack.  She complains of severe pain to her left ankle.  She also does have some pain to her right ankle but is not as severe.  No head injury or loss of consciousness.     Home Medications Prior to Admission medications   Medication Sig Start Date End Date Taking? Authorizing Provider  folic acid (FOLVITE) 1 MG tablet Take 1 mg by mouth daily. 04/12/22  Yes [provider]  ondansetron (ZOFRAN-ODT) 4 MG disintegrating tablet Take 1 tablet (4 mg total) by mouth every 8 (eight) hours as needed for nausea or vomiting. 06/30/22  Yes Quintella Reichert, MD  oxyCODONE-acetaminophen (PERCOCET/ROXICET) 5-325 MG tablet Take 1 tablet by mouth every 6 (six) hours as needed for severe pain. 06/30/22  Yes Quintella Reichert, MD  amphetamine-dextroamphetamine (ADDERALL XR) 20 MG 24 hr capsule Take 20 mg by mouth daily. 01/10/21   [provider]  BIOTIN PO Take 1 tablet by mouth daily.    [provider]  ibuprofen (ADVIL,MOTRIN) 200 MG tablet Take 600 mg by mouth every 6 (six) hours as needed for headache, mild pain or moderate pain.     [provider]  montelukast (SINGULAIR) 10 MG tablet Take 1 tablet (10 mg total) by mouth at bedtime. Patient not taking: Reported on 02/03/2021 04/19/16   Kennith Gain, MD  NUVARING 0.12-0.015 MG/24HR vaginal ring  SMARTSIG:Ring Vaginal Every 4 Weeks 12/02/20   [provider]  OLANZapine (ZYPREXA) 2.5 MG tablet Take 2.5 mg by mouth daily. 12/02/20   [provider]  promethazine (PHENERGAN) 25 MG tablet Take 1 tablet (25 mg total) by mouth every 6 (six) hours as needed for nausea. 05/16/15   Excell Seltzer, MD  QULIPTA 60 MG TABS Take 1 tablet by mouth daily. 08/10/21   Genia Harold, MD  ranitidine (ZANTAC) 150 MG tablet Take 1 tablet (150 mg total) by mouth 2 (two) times daily. Patient not taking: Reported on 02/03/2021 04/19/16   Kennith Gain, MD  sertraline (ZOLOFT) 50 MG tablet  03/21/16   [provider]  SUMAtriptan (IMITREX) 50 MG tablet Take 1 tablet (50 mg total) by mouth 2 (two) times daily as needed for migraine. May repeat in 2 hours if migraine persist. 02/03/21   Genia Harold, MD      Allergies    Sulfa antibiotics    Review of Systems   Review of Systems  All other systems reviewed and are negative.   Physical Exam Updated Vital Signs BP (!) 129/96   Pulse (!) 108   Resp 18   Wt 72.6 kg   SpO2 100%   BMI 25.06 kg/m  Physical Exam Vitals and nursing note reviewed.  Constitutional:      Appearance: She is well-developed.  HENT:     Head: Normocephalic and atraumatic.  Cardiovascular:  Rate and Rhythm: Normal rate and regular rhythm.     Heart sounds: No murmur heard. Pulmonary:     Effort: Pulmonary effort is normal. No respiratory distress.     Breath sounds: Normal breath sounds.  Abdominal:     Palpations: Abdomen is soft.     Tenderness: There is no abdominal tenderness. There is no guarding or rebound.  Musculoskeletal:     Comments: Severe deformity with skin tenting to the left ankle.  2+ DP pulses bilaterally.  There is mild swelling and tenderness to the right ankle.  No appreciable tenderness over the left knee. There is mild soft tissue swelling and tenderness over the right ankle, predominantly over the medial  malleolus.    Skin:    General: Skin is warm and dry.  Neurological:     Mental Status: She is alert and oriented to person, place, and time.  Psychiatric:        Behavior: Behavior normal.     ED Results / Procedures / Treatments   Labs (all labs ordered are listed, but only abnormal results are displayed) Labs Reviewed - No data to display  EKG None  Radiology CT Ankle Left Wo Contrast  Result Date: 06/30/2022 CLINICAL DATA:  Slipped and fell while walking in wet grass with left ankle deformity. EXAM: CT OF THE LEFT ANKLE WITHOUT CONTRAST TECHNIQUE: Multidetector CT imaging of the left ankle was performed according to the standard protocol. Multiplanar CT image reconstructions were also generated. RADIATION DOSE REDUCTION: This exam was performed according to the departmental dose-optimization program which includes automated exposure control, adjustment of the mA and/or kV according to patient size and/or use of iterative reconstruction technique. COMPARISON:  Same day radiographs FINDINGS: Bones/Joint/Cartilage Trimalleolar fracture of the left ankle. This includes a comminuted posterior malleolus fracture with fracture line extending into the tibiotalar joint. Medial malleolus fracture with approximately 4 mm of displacement of the dominant fragment. Oblique fracture through the distal left fibula extending into the tibiofibular syndesmosis at the level of the ankle mortise. Slight posterior displacement of the distal fibula fragment measuring approximately 4 mm. Small amount of gas in the tibiotalar joint. Ligaments Suboptimally assessed by CT. Muscles and Tendons Unremarkable CT appearance. Soft tissues Mild edema about the ankle. IMPRESSION: Trimalleolar fracture of the left ankle as described. Electronically Signed   By: Placido Sou M.D.   On: 06/30/2022 03:02   DG Knee Complete 4 Views Left  Result Date: 06/30/2022 CLINICAL DATA:  Status post fall. EXAM: LEFT KNEE - COMPLETE 4+  VIEW COMPARISON:  None Available. FINDINGS: No evidence of fracture, dislocation, or joint effusion. No evidence of arthropathy or other focal bone abnormality. Soft tissues are unremarkable. IMPRESSION: Negative. Electronically Signed   By: Virgina Norfolk M.D.   On: 06/30/2022 01:57   DG Ankle Complete Left  Result Date: 06/30/2022 CLINICAL DATA:  Post reduction images. EXAM: LEFT ANKLE COMPLETE - 3+ VIEW COMPARISON:  June 30, 2022 (12:38 a.m.) FINDINGS: The left ankle was imaged in a fiberglass cast with subsequently obscured osseous detail. Acute fracture deformities are seen involving the left lateral malleolus, left medial malleolus and left posterior malleolus. There is gross anatomic alignment without evidence of dislocation. Diffuse soft tissue swelling is seen. IMPRESSION: 1. Status post reduction of acute trimalleolar fracture of the left ankle. 2. Interval reduction of the associated lateral left ankle dislocation seen on the prior study. Electronically Signed   By: Virgina Norfolk M.D.   On: 06/30/2022 01:55  DG Ankle Complete Right  Result Date: 06/30/2022 CLINICAL DATA:  Status post fall. EXAM: RIGHT ANKLE - COMPLETE 3+ VIEW COMPARISON:  None Available. FINDINGS: There is no evidence of an acute fracture, dislocation, or joint effusion. A moderate to large plantar calcaneal spur is seen. There is no evidence of arthropathy or other focal bone abnormality. Soft tissues are unremarkable. IMPRESSION: No acute osseous abnormality. Electronically Signed   By: Virgina Norfolk M.D.   On: 06/30/2022 01:53   DG Ankle 2 Views Left  Result Date: 06/30/2022 CLINICAL DATA:  Rolled ankle EXAM: LEFT ANKLE - 2 VIEW COMPARISON:  None Available. FINDINGS: Only one view is submitted for interpretation. There is a comminuted fracture of the distal left tibia. The talus is dislocated laterally 1 full shaft width in comparison with the distal tibia. Displaced oblique fracture of the distal left  fibula with apex medial angulation. IMPRESSION: 1. Fracture-dislocation of the left ankle as described. Electronically Signed   By: Placido Sou M.D.   On: 06/30/2022 01:19    Procedures .Sedation  Date/Time: 06/30/2022 1:12 AM  Performed by: Quintella Reichert, MD Authorized by: Quintella Reichert, MD   Consent:    Consent obtained:  Verbal   Consent given by:  Patient   Risks discussed:  Allergic reaction, dysrhythmia, prolonged sedation necessitating reversal, inadequate sedation, nausea, vomiting and prolonged hypoxia resulting in organ damage Universal protocol:    Immediately prior to procedure, a time out was called: yes   Indications:    Procedure performed:  Dislocation reduction   Procedure necessitating sedation performed by:  Different physician Pre-sedation assessment:    Time since last food or drink:  2   ASA classification: class 1 - normal, healthy patient     Mouth opening:  3 or more finger widths   Mallampati score:  II - soft palate, uvula, fauces visible   Neck mobility: normal     Pre-sedation assessments completed and reviewed: airway patency, cardiovascular function, hydration status, mental status, nausea/vomiting, pain level and respiratory function   Immediate pre-procedure details:    Reassessment: Patient reassessed immediately prior to procedure   Procedure details (see MAR for exact dosages):    Total Provider sedation time (minutes):  10 Post-procedure details:    Post-sedation assessment completed:  06/30/2022 1:12 AM     Medications Ordered in ED Medications  HYDROmorphone (DILAUDID) injection 1 mg (1 mg Intravenous Given 06/30/22 0035)  propofol (DIPRIVAN) 10 mg/mL bolus/IV push 36.3 mg (50 mg Intravenous Given 06/30/22 0053)  0.9 %  sodium chloride infusion (0 mLs Intravenous Stopped 06/30/22 0211)  propofol (DIPRIVAN) 10 mg/mL bolus/IV push (10 mg Intravenous Given 06/30/22 0055)  LORazepam (ATIVAN) injection 0.5 mg (0.5 mg Intravenous Given  06/30/22 0153)  ondansetron (ZOFRAN) injection 4 mg (4 mg Intravenous Given 06/30/22 0303)  oxyCODONE-acetaminophen (PERCOCET/ROXICET) 5-325 MG per tablet 1 tablet (1 tablet Oral Given 06/30/22 0358)    ED Course/ Medical Decision Making/ A&P                             Medical Decision Making Amount and/or Complexity of Data Reviewed Radiology: ordered.  Risk Prescription drug management.   Patient here for evaluation after a slip and fall with ankle injury.  She has a severe fracture dislocation of the left ankle was in distress at time of ED arrival.  She was treated with hydromorphone for pain and procedural sedation was performed for fracture dislocation reduction  Final Clinical Impression(s) / ED Diagnoses Final diagnoses:  Trimalleolar fracture of ankle, closed, left, initial encounter  Fall, initial encounter    Rx / DC Orders ED Discharge Orders          Ordered    oxyCODONE-acetaminophen (PERCOCET/ROXICET) 5-325 MG tablet  Every 6 hours PRN        06/30/22 0252    ondansetron (ZOFRAN-ODT) 4 MG disintegrating tablet  Every 8 hours PRN        06/30/22 0402              Quintella Reichert, MD 06/30/22 [phone]

## 2022-06-30 NOTE — ED Provider Notes (Signed)
Reduction of fracture  Date/Time: 06/30/2022 2:58 AM  Performed by: Antonietta Breach, PA-C Authorized by: Antonietta Breach, PA-C  Consent: The procedure was performed in an emergent situation. Verbal consent obtained. Written consent obtained. Risks and benefits: risks, benefits and alternatives were discussed Consent given by: patient Patient understanding: patient states understanding of the procedure being performed Patient consent: the patient's understanding of the procedure matches consent given Procedure consent: procedure consent matches procedure scheduled Relevant documents: relevant documents present and verified Test results: test results available and properly labeled Site marked: the operative site was marked Imaging studies: imaging studies available Required items: required blood products, implants, devices, and special equipment available Patient identity confirmed: verbally with patient and arm band Time out: Immediately prior to procedure a "time out" was called to verify the correct patient, procedure, equipment, support staff and site/side marked as required.  Sedation: Patient sedated: yes Sedation type: anxiolysis Sedatives: propofol (See separate sedation note for further detail) Analgesia: hydromorphone  Patient tolerance: patient tolerated the procedure well with no immediate complications Comments: Successful reduction of L ankle fracture dislocation       Antonietta Breach, PA-C 06/30/22 0300    Quintella Reichert, MD 06/30/22 [phone]

## 2022-06-30 NOTE — ED Triage Notes (Signed)
Pt presents to ED with obvious deformity to left ankle.  Pt screaming in pain.  Pt was walking in wet grass and slipped.  Husband at bedside. Dr. Ralene Bathe at bedside.

## 2022-06-30 NOTE — Progress Notes (Signed)
Orthopedic Tech Progress Note Patient Details:  Maria Berg Apr 16, 1981 [id number] Patient came in with an ankle deformity. I to applied splint after MD/PA reduce ankle.  Ortho Devices Type of Ortho Device: Ace wrap, Cotton web roll, Stirrup splint, Short leg splint Ortho Device/Splint Location: LLE Ortho Device/Splint Interventions: Ordered, Application   Post Interventions Patient Tolerated: Well, Fair Instructions Provided: Care of device  Janit Pagan 06/30/2022, 1:25 AM

## 2022-06-30 NOTE — ED Notes (Signed)
Ortho Tech called. 

## 2022-07-03 ENCOUNTER — Encounter (HOSPITAL_BASED_OUTPATIENT_CLINIC_OR_DEPARTMENT_OTHER): Payer: Self-pay | Admitting: Orthopaedic Surgery

## 2022-07-03 ENCOUNTER — Other Ambulatory Visit: Payer: Self-pay

## 2022-07-04 NOTE — Anesthesia Preprocedure Evaluation (Signed)
Anesthesia Evaluation  Patient identified by MRN, date of birth, ID band Patient awake    Reviewed: Allergy & Precautions, NPO status , Patient's Chart, lab work & pertinent test results  History of Anesthesia Complications (+) PONV and history of anesthetic complications  Airway Mallampati: I       Dental no notable dental hx.    Pulmonary neg pulmonary ROS   Pulmonary exam normal        Cardiovascular Normal cardiovascular exam     Neuro/Psych  Headaches  Anxiety        GI/Hepatic negative GI ROS, Neg liver ROS,,,  Endo/Other  negative endocrine ROS    Renal/GU negative Renal ROS  negative genitourinary   Musculoskeletal   Abdominal Normal abdominal exam  (+)   Peds  Hematology negative hematology ROS (+)   Anesthesia Other Findings   Reproductive/Obstetrics negative OB ROS                             Anesthesia Physical Anesthesia Plan  ASA: 2  Anesthesia Plan: General   Post-op Pain Management: Regional block*   Induction: Intravenous  PONV Risk Score and Plan: Ondansetron, Dexamethasone and Midazolam  Airway Management Planned: LMA  Additional Equipment: None  Intra-op Plan:   Post-operative Plan: Extubation in OR  Informed Consent: I have reviewed the patients History and Physical, chart, labs and discussed the procedure including the risks, benefits and alternatives for the proposed anesthesia with the patient or authorized representative who has indicated his/her understanding and acceptance.     Dental advisory given  Plan Discussed with: CRNA  Anesthesia Plan Comments:        Anesthesia Quick Evaluation

## 2022-07-05 ENCOUNTER — Ambulatory Visit (HOSPITAL_BASED_OUTPATIENT_CLINIC_OR_DEPARTMENT_OTHER)
Admission: RE | Admit: 2022-07-05 | Discharge: 2022-07-05 | Disposition: A | Discharge status: Home / Self Care | Payer: BC Managed Care – PPO | Attending: Orthopaedic Surgery | Admitting: Orthopaedic Surgery

## 2022-07-05 ENCOUNTER — Ambulatory Visit (HOSPITAL_BASED_OUTPATIENT_CLINIC_OR_DEPARTMENT_OTHER): Payer: BC Managed Care – PPO | Admitting: Anesthesiology

## 2022-07-05 ENCOUNTER — Other Ambulatory Visit: Payer: Self-pay

## 2022-07-05 ENCOUNTER — Encounter (HOSPITAL_BASED_OUTPATIENT_CLINIC_OR_DEPARTMENT_OTHER)
Admission: RE | Disposition: A | Discharge status: Home / Self Care | Payer: Self-pay | Source: Home / Self Care | Attending: Orthopaedic Surgery

## 2022-07-05 ENCOUNTER — Ambulatory Visit (HOSPITAL_BASED_OUTPATIENT_CLINIC_OR_DEPARTMENT_OTHER): Payer: BC Managed Care – PPO

## 2022-07-05 ENCOUNTER — Encounter (HOSPITAL_BASED_OUTPATIENT_CLINIC_OR_DEPARTMENT_OTHER): Payer: Self-pay | Admitting: Orthopaedic Surgery

## 2022-07-05 DIAGNOSIS — S82852A Displaced trimalleolar fracture of left lower leg, initial encounter for closed fracture: Secondary | ICD-10-CM | POA: Insufficient documentation

## 2022-07-05 DIAGNOSIS — W010XXA Fall on same level from slipping, tripping and stumbling without subsequent striking against object, initial encounter: Secondary | ICD-10-CM | POA: Insufficient documentation

## 2022-07-05 DIAGNOSIS — Z85828 Personal history of other malignant neoplasm of skin: Secondary | ICD-10-CM | POA: Insufficient documentation

## 2022-07-05 DIAGNOSIS — S93432A Sprain of tibiofibular ligament of left ankle, initial encounter: Secondary | ICD-10-CM | POA: Diagnosis not present

## 2022-07-05 DIAGNOSIS — Z01818 Encounter for other preprocedural examination: Secondary | ICD-10-CM

## 2022-07-05 HISTORY — DX: Other specified postprocedural states: Z98.890

## 2022-07-05 HISTORY — PX: ORIF ANKLE FRACTURE: SHX5408

## 2022-07-05 HISTORY — DX: Other specified postprocedural states: R11.2

## 2022-07-05 HISTORY — PX: SYNDESMOSIS REPAIR: SHX5182

## 2022-07-05 LAB — POCT PREGNANCY, URINE: Preg Test, Ur: NEGATIVE

## 2022-07-05 SURGERY — OPEN REDUCTION INTERNAL FIXATION (ORIF) ANKLE FRACTURE
Anesthesia: General | Site: Ankle | Laterality: Left

## 2022-07-05 MED ORDER — OXYCODONE HCL 5 MG/5ML PO SOLN: 5.0000 mg | Freq: Once | ORAL | Status: DC | PRN

## 2022-07-05 MED ORDER — ACETAMINOPHEN 160 MG/5ML PO SOLN: 325.0000 mg | ORAL | Status: DC | PRN

## 2022-07-05 MED ORDER — FENTANYL CITRATE (PF) 100 MCG/2ML IJ SOLN
25.0000 ug | INTRAMUSCULAR | Status: DC | PRN
  Administered 2022-07-05: 50 ug via INTRAVENOUS
  Administered 2022-07-05 (×2): 25 ug via INTRAVENOUS

## 2022-07-05 MED ORDER — FENTANYL CITRATE (PF) 100 MCG/2ML IJ SOLN
INTRAMUSCULAR | Status: AC
  Filled 2022-07-05: qty 2

## 2022-07-05 MED ORDER — DEXAMETHASONE SODIUM PHOSPHATE 4 MG/ML IJ SOLN
INTRAMUSCULAR | Status: DC | PRN
  Administered 2022-07-05: 5 mg via INTRAVENOUS

## 2022-07-05 MED ORDER — SCOPOLAMINE 1 MG/3DAYS TD PT72
MEDICATED_PATCH | TRANSDERMAL | Status: AC
  Filled 2022-07-05: qty 1

## 2022-07-05 MED ORDER — ACETAMINOPHEN 500 MG PO TABS
1000.0000 mg | ORAL_TABLET | Freq: Once | ORAL | Status: AC
  Administered 2022-07-05: 1000 mg via ORAL

## 2022-07-05 MED ORDER — BUPIVACAINE LIPOSOME 1.3 % IJ SUSP
INTRAMUSCULAR | Status: DC | PRN
  Administered 2022-07-05 (×2): 5 mL via PERINEURAL

## 2022-07-05 MED ORDER — ONDANSETRON HCL 4 MG PO TABS: 4.0000 mg | ORAL_TABLET | Freq: Three times a day (TID) | ORAL | 0 refills | Status: AC | PRN

## 2022-07-05 MED ORDER — ONDANSETRON HCL 4 MG/2ML IJ SOLN
INTRAMUSCULAR | Status: DC | PRN
  Administered 2022-07-05: 4 mg via INTRAVENOUS

## 2022-07-05 MED ORDER — LACTATED RINGERS IV SOLN: INTRAVENOUS | Status: DC

## 2022-07-05 MED ORDER — SCOPOLAMINE 1 MG/3DAYS TD PT72
MEDICATED_PATCH | TRANSDERMAL | Status: DC | PRN
  Administered 2022-07-05: 1 via TRANSDERMAL

## 2022-07-05 MED ORDER — ACETAMINOPHEN 500 MG PO TABS: 1000.0000 mg | ORAL_TABLET | Freq: Three times a day (TID) | ORAL | 0 refills | Status: AC

## 2022-07-05 MED ORDER — CEFAZOLIN SODIUM-DEXTROSE 2-4 GM/100ML-% IV SOLN
INTRAVENOUS | Status: AC
  Filled 2022-07-05: qty 100

## 2022-07-05 MED ORDER — MELOXICAM 7.5 MG PO TABS: 7.5000 mg | ORAL_TABLET | Freq: Two times a day (BID) | ORAL | 0 refills | Status: AC

## 2022-07-05 MED ORDER — VANCOMYCIN HCL 1000 MG IV SOLR
INTRAVENOUS | Status: DC | PRN
  Administered 2022-07-05: 1000 mg via TOPICAL

## 2022-07-05 MED ORDER — KETOROLAC TROMETHAMINE 30 MG/ML IJ SOLN
INTRAMUSCULAR | Status: AC
  Filled 2022-07-05: qty 1

## 2022-07-05 MED ORDER — MEPERIDINE HCL 25 MG/ML IJ SOLN: 6.2500 mg | INTRAMUSCULAR | Status: DC | PRN

## 2022-07-05 MED ORDER — DEXMEDETOMIDINE HCL IN NACL 80 MCG/20ML IV SOLN
INTRAVENOUS | Status: DC | PRN
  Administered 2022-07-05: 8 ug via BUCCAL
  Administered 2022-07-05: 12 ug via BUCCAL

## 2022-07-05 MED ORDER — CEFAZOLIN SODIUM-DEXTROSE 2-4 GM/100ML-% IV SOLN
2.0000 g | INTRAVENOUS | Status: AC
  Administered 2022-07-05: 2 g via INTRAVENOUS

## 2022-07-05 MED ORDER — MIDAZOLAM HCL 2 MG/2ML IJ SOLN
2.0000 mg | Freq: Once | INTRAMUSCULAR | Status: AC
  Administered 2022-07-05: 2 mg via INTRAVENOUS

## 2022-07-05 MED ORDER — FENTANYL CITRATE (PF) 100 MCG/2ML IJ SOLN
INTRAMUSCULAR | Status: DC | PRN
  Administered 2022-07-05 (×3): 50 ug via INTRAVENOUS

## 2022-07-05 MED ORDER — PROPOFOL 10 MG/ML IV BOLUS
INTRAVENOUS | Status: AC
  Filled 2022-07-05: qty 20

## 2022-07-05 MED ORDER — GABAPENTIN 100 MG PO CAPS: 100.0000 mg | ORAL_CAPSULE | Freq: Three times a day (TID) | ORAL | 0 refills | Status: AC

## 2022-07-05 MED ORDER — PROPOFOL 10 MG/ML IV BOLUS
INTRAVENOUS | Status: DC | PRN
  Administered 2022-07-05: 180 mg via INTRAVENOUS

## 2022-07-05 MED ORDER — DEXAMETHASONE SODIUM PHOSPHATE 10 MG/ML IJ SOLN
INTRAMUSCULAR | Status: AC
  Filled 2022-07-05: qty 1

## 2022-07-05 MED ORDER — LIDOCAINE HCL (CARDIAC) PF 100 MG/5ML IV SOSY
PREFILLED_SYRINGE | INTRAVENOUS | Status: DC | PRN
  Administered 2022-07-05: 100 mg via INTRAVENOUS

## 2022-07-05 MED ORDER — MIDAZOLAM HCL 2 MG/2ML IJ SOLN
INTRAMUSCULAR | Status: AC
  Filled 2022-07-05: qty 2

## 2022-07-05 MED ORDER — KETOROLAC TROMETHAMINE 30 MG/ML IJ SOLN: 30.0000 mg | Freq: Once | INTRAMUSCULAR | Status: DC | PRN

## 2022-07-05 MED ORDER — ACETAMINOPHEN 325 MG PO TABS: 325.0000 mg | ORAL_TABLET | ORAL | Status: DC | PRN

## 2022-07-05 MED ORDER — AMISULPRIDE (ANTIEMETIC) 5 MG/2ML IV SOLN
INTRAVENOUS | Status: AC
  Filled 2022-07-05: qty 4

## 2022-07-05 MED ORDER — BUPIVACAINE HCL (PF) 0.5 % IJ SOLN
INTRAMUSCULAR | Status: DC | PRN
  Administered 2022-07-05 (×8): 5 mL via PERINEURAL

## 2022-07-05 MED ORDER — EPINEPHRINE PF 1 MG/ML IJ SOLN
INTRAMUSCULAR | Status: AC
  Filled 2022-07-05: qty 6

## 2022-07-05 MED ORDER — ASPIRIN 81 MG PO CHEW: 81.0000 mg | CHEWABLE_TABLET | Freq: Two times a day (BID) | ORAL | 0 refills | Status: AC

## 2022-07-05 MED ORDER — AMISULPRIDE (ANTIEMETIC) 5 MG/2ML IV SOLN
10.0000 mg | Freq: Once | INTRAVENOUS | Status: AC
  Administered 2022-07-05: 10 mg via INTRAVENOUS

## 2022-07-05 MED ORDER — VANCOMYCIN HCL 1000 MG IV SOLR
INTRAVENOUS | Status: AC
  Filled 2022-07-05: qty 20

## 2022-07-05 MED ORDER — LIDOCAINE 2% (20 MG/ML) 5 ML SYRINGE
INTRAMUSCULAR | Status: AC
  Filled 2022-07-05: qty 5

## 2022-07-05 MED ORDER — OXYCODONE HCL 5 MG PO TABS: 5.0000 mg | ORAL_TABLET | Freq: Once | ORAL | Status: DC | PRN

## 2022-07-05 MED ORDER — KETOROLAC TROMETHAMINE 30 MG/ML IJ SOLN
INTRAMUSCULAR | Status: DC | PRN
  Administered 2022-07-05: 30 mg via INTRAVENOUS

## 2022-07-05 MED ORDER — ACETAMINOPHEN 500 MG PO TABS
ORAL_TABLET | ORAL | Status: AC
  Filled 2022-07-05: qty 2

## 2022-07-05 MED ORDER — ONDANSETRON HCL 4 MG/2ML IJ SOLN: 4.0000 mg | Freq: Once | INTRAMUSCULAR | Status: DC | PRN

## 2022-07-05 MED ORDER — ONDANSETRON HCL 4 MG/2ML IJ SOLN
INTRAMUSCULAR | Status: AC
  Filled 2022-07-05: qty 2

## 2022-07-05 MED ORDER — GABAPENTIN 300 MG PO CAPS
ORAL_CAPSULE | ORAL | Status: AC
  Filled 2022-07-05: qty 1

## 2022-07-05 MED ORDER — 0.9 % SODIUM CHLORIDE (POUR BTL) OPTIME
TOPICAL | Status: DC | PRN
  Administered 2022-07-05: 500 mL

## 2022-07-05 MED ORDER — GABAPENTIN 300 MG PO CAPS
300.0000 mg | ORAL_CAPSULE | Freq: Once | ORAL | Status: AC
  Administered 2022-07-05: 300 mg via ORAL

## 2022-07-05 MED ORDER — MIDAZOLAM HCL 5 MG/5ML IJ SOLN
INTRAMUSCULAR | Status: DC | PRN
  Administered 2022-07-05: 2 mg via INTRAVENOUS

## 2022-07-05 MED ORDER — FENTANYL CITRATE (PF) 100 MCG/2ML IJ SOLN
100.0000 ug | Freq: Once | INTRAMUSCULAR | Status: AC
  Administered 2022-07-05: 100 ug via INTRAVENOUS

## 2022-07-05 MED ORDER — OXYCODONE HCL 5 MG PO TABS: ORAL_TABLET | ORAL | 0 refills | Status: AC

## 2022-07-05 SURGICAL SUPPLY — 85 items
APL PRP STRL LF DISP 70% ISPRP (MISCELLANEOUS) ×1
BIT DRILL 2 CANN GRADUATED (BIT) IMPLANT
BIT DRILL 2.5 CANN LNG (BIT) IMPLANT
BIT DRILL 2.5 CANN STRL (BIT) IMPLANT
BIT DRILL 2.6 CANN (BIT) IMPLANT
BIT DRILL 2.7 (BIT) ×1
BIT DRILL 2.7X2.7/3XSCR ANKL (BIT) IMPLANT
BIT DRL 2.7X2.7/3XSCR ANKL (BIT) ×1
BLADE SURG 10 STRL SS (BLADE) ×2 IMPLANT
BLADE SURG 15 STRL LF DISP TIS (BLADE) ×4 IMPLANT
BLADE SURG 15 STRL SS (BLADE) ×2
BNDG CMPR 5X4 CHSV STRCH STRL (GAUZE/BANDAGES/DRESSINGS) ×1
BNDG CMPR 5X62 HK CLSR LF (GAUZE/BANDAGES/DRESSINGS) ×1
BNDG CMPR 6"X 5 YARDS HK CLSR (GAUZE/BANDAGES/DRESSINGS) ×1
BNDG CMPR 9X4 STRL LF SNTH (GAUZE/BANDAGES/DRESSINGS)
BNDG COHESIVE 4X5 TAN STRL LF (GAUZE/BANDAGES/DRESSINGS) ×2 IMPLANT
BNDG ELASTIC 4X5.8 VLCR STR LF (GAUZE/BANDAGES/DRESSINGS) ×2 IMPLANT
BNDG ELASTIC 6INX 5YD STR LF (GAUZE/BANDAGES/DRESSINGS) ×2 IMPLANT
BNDG ESMARK 4X9 LF (GAUZE/BANDAGES/DRESSINGS) IMPLANT
CHLORAPREP W/TINT 26 (MISCELLANEOUS) ×2 IMPLANT
CLEANER CAUTERY TIP 5X5 PAD (MISCELLANEOUS) ×2 IMPLANT
CLSR STERI-STRIP ANTIMIC 1/2X4 (GAUZE/BANDAGES/DRESSINGS) ×2 IMPLANT
COVER BACK TABLE 60X90IN (DRAPES) ×2 IMPLANT
CUFF TOURN SGL QUICK 24 (TOURNIQUET CUFF)
CUFF TOURN SGL QUICK 34 (TOURNIQUET CUFF)
CUFF TRNQT CYL 24X4X16.5-23 (TOURNIQUET CUFF) IMPLANT
CUFF TRNQT CYL 34X4.125X (TOURNIQUET CUFF) IMPLANT
DRAPE EXTREMITY T 121X128X90 (DISPOSABLE) ×2 IMPLANT
DRAPE IMP U-DRAPE 54X76 (DRAPES) ×2 IMPLANT
DRAPE OEC MINIVIEW 54X84 (DRAPES) ×2 IMPLANT
DRAPE U-SHAPE 47X51 STRL (DRAPES) ×2 IMPLANT
ELECT REM PT RETURN 9FT ADLT (ELECTROSURGICAL) ×1
ELECTRODE REM PT RTRN 9FT ADLT (ELECTROSURGICAL) ×2 IMPLANT
GAUZE PAD ABD 8X10 STRL (GAUZE/BANDAGES/DRESSINGS) ×2 IMPLANT
GAUZE SPONGE 4X4 12PLY STRL (GAUZE/BANDAGES/DRESSINGS) ×2 IMPLANT
GLOVE BIO SURGEON STRL SZ 6.5 (GLOVE) ×2 IMPLANT
GLOVE BIO SURGEON STRL SZ8 (GLOVE) ×2 IMPLANT
GLOVE BIOGEL PI IND STRL 6.5 (GLOVE) ×2 IMPLANT
GLOVE BIOGEL PI IND STRL 8 (GLOVE) ×2 IMPLANT
GLOVE ECLIPSE 8.0 STRL XLNG CF (GLOVE) ×2 IMPLANT
GOWN STRL REUS W/ TWL LRG LVL3 (GOWN DISPOSABLE) ×2 IMPLANT
GOWN STRL REUS W/TWL LRG LVL3 (GOWN DISPOSABLE) ×1
GOWN STRL REUS W/TWL XL LVL3 (GOWN DISPOSABLE) ×2 IMPLANT
GUIDEWIRE 1.35MM (WIRE) IMPLANT
IMPL TIGHTROP W/DRV K-LESS (Anchor) IMPLANT
IMPLANT TIGHTROPE W/DRV K-LESS (Anchor) ×1 IMPLANT
NDL HYPO 22X1.5 SAFETY MO (MISCELLANEOUS) IMPLANT
NEEDLE HYPO 22X1.5 SAFETY MO (MISCELLANEOUS) IMPLANT
NEEDLE SAFETY HYPO 22GAX1.5 (MISCELLANEOUS)
NS IRRIG 1000ML POUR BTL (IV SOLUTION) ×2 IMPLANT
PACK BASIN DAY SURGERY FS (CUSTOM PROCEDURE TRAY) ×2 IMPLANT
PAD CAST 4YDX4 CTTN HI CHSV (CAST SUPPLIES) ×2 IMPLANT
PADDING CAST ABS COTTON 4X4 ST (CAST SUPPLIES) ×4 IMPLANT
PADDING CAST COTTON 4X4 STRL (CAST SUPPLIES) ×1
PADDING CAST COTTON 6X4 STRL (CAST SUPPLIES) ×2 IMPLANT
PENCIL SMOKE EVACUATOR (MISCELLANEOUS) ×2 IMPLANT
PLATE LOCK DIST FIB 5H LT SS (Plate) IMPLANT
SCREW COMP KREULOCK 2.7X12 (Screw) IMPLANT
SCREW COMP KREULOCK 2.7X14 (Screw) IMPLANT
SCREW COMP KREULOCK 2.7X16 (Screw) IMPLANT
SCREW COMP KREULOCK 3.5X12 (Screw) IMPLANT
SCREW CORTICAL 2.7X18 LO PRO (Screw) IMPLANT
SCREW LOW PROFILE 3.5X14 (Screw) IMPLANT
SCREW LOW PROFILE 4.0X40 (Screw) IMPLANT
SCREW LOW PROFILE CANN 4.0X45 (Screw) IMPLANT
SCREW NON-LOCKING 3.5X12MM (Screw) IMPLANT
SLEEVE SCD COMPRESS KNEE MED (STOCKING) IMPLANT
SPIKE FLUID TRANSFER (MISCELLANEOUS) IMPLANT
SPLINT PLASTER CAST FAST 5X30 (CAST SUPPLIES) ×40 IMPLANT
SPONGE T-LAP 18X18 ~~LOC~~+RFID (SPONGE) ×2 IMPLANT
SUCTION FRAZIER HANDLE 10FR (MISCELLANEOUS) ×1
SUCTION TUBE FRAZIER 10FR DISP (MISCELLANEOUS) ×2 IMPLANT
SUT MNCRL AB 4-0 PS2 18 (SUTURE) IMPLANT
SUT VIC AB 0 CT1 27 (SUTURE) ×1
SUT VIC AB 0 CT1 27XBRD ANBCTR (SUTURE) ×2 IMPLANT
SUT VIC AB 3-0 SH 27 (SUTURE) ×1
SUT VIC AB 3-0 SH 27X BRD (SUTURE) ×2 IMPLANT
SYR BULB EAR ULCER 3OZ GRN STR (SYRINGE) ×2 IMPLANT
SYR CONTROL 10ML LL (SYRINGE) IMPLANT
TOWEL GREEN STERILE FF (TOWEL DISPOSABLE) ×4 IMPLANT
TUBE CONNECTING 20X1/4 (TUBING) ×2 IMPLANT
TUBE SUCTION HIGH CAP CLEAR NV (SUCTIONS) ×2 IMPLANT
UNDERPAD 30X36 HEAVY ABSORB (UNDERPADS AND DIAPERS) ×2 IMPLANT
WASHER (Orthopedic Implant) ×1 IMPLANT
WASHER ORTHO 7X (Orthopedic Implant) IMPLANT

## 2022-07-05 NOTE — Interval H&P Note (Signed)
All questions answered, patient wants to proceed with procedure.  

## 2022-07-05 NOTE — Op Note (Signed)
Orthopaedic Surgery Operative Note (CSN: [id number])  Maria Berg  1981-04-01 Date of Surgery: 07/05/2022   Diagnoses:  Left trimalleolar ankle fracture dislocation  Procedure: Left open reduction internal fixation of trimalleolar ankle fracture with fixation of posterior lip Left open reduction from fixation of syndesmosis   Operative Finding Successful completion of the planned procedure.  This is a very complicated surgery relative to normal as the posterior malleolus was essentially a large portion of the tibial plafond.  There was a intercalary fragment that was turned to 180 degrees with the articular surface and superiorly.  Based on its location and the level comminution I worried that a true posterior approach would end up devitalized most of the small comminuted fragments and lead to a high risk of loose body formation and possible devitalization of the small fragments.  We instead went through the medial malleolar fragment and dislocated the ankle allowing [place] access through the medial window to the posterior malleolus.  The we then carefully were able to rotate this fragment into an anatomic reduction and visually checked its position in relation to the rest of the plaque found.  We were then able to advance a K wire under direct visualization of the entire articular surface through this fragment from front to back and captured.  We advanced this and captured some of the comminuted fragments in the posterior aspect of the tibia as well.  Fixation of the medial malleolus and the fibula relatively routine and we use a syndesmosis tight rope to augment our fixation and repair of the syndesmosis.  I do worry about the patient's overall likelihood of arthritis as she had extreme comminution of the posterior aspect of the tibia.  That said the case went quite well and I was very happy with the overall fixation.  We will keep her touchdown for 6-weeks total.  We closed the incisions with  nylon and thus I feel that we will likely do a splint for a week and either cast for 2 weeks or if she is more comfortable a boot would be appropriate though we would not start range of motion exercises until 3 weeks regardless.  There is a world where we would cast her after her initial splinting until the 3-week mark and then remove her from a cast take out her sutures and start therapy with weightbearing to start in the boot at 6 weeks and wean from the boot at 8.  Post-operative plan: The patient will be discharged home.  DVT prophylaxis Aspirin 81 mg twice daily for 6 weeks.   Pain control with PRN pain medication preferring oral medicines.  Follow up plan will be scheduled in approximately 7 days for incision check and XR.  Post-Op Diagnosis: Same Surgeons:Primary: Hiram Gash, MD Assistants:Caroline McBane PA-C Location: [facility] OR ROOM 1 Anesthesia: General with regional anesthesia Antibiotics: Ancef 2 g with local vancomycin powder 1 g at the surgical site Tourniquet time:  Total Tourniquet Time Documented: Calf (Left) - 77 minutes Total: Calf (Left) - 77 minutes  Estimated Blood Loss: Minimal Complications: None Specimens: None Implants: Implant Name Type Inv. Item Serial No. Manufacturer Lot No. LRB No. Used Action  IMPLANT TIGHTROPE W/DRV K-LESS - [id number] Anchor IMPLANT TIGHTROPE W/DRV [employer] [id number] Left 1 Implanted  SCREW CORTICAL 2.7X18 LO PRO - [id number] Screw SCREW CORTICAL 2.7X18 LO PRO  [employer]  Left 1 Implanted  SCREW LOW PROFILE 3.5X14 - [id number] Screw SCREW LOW PROFILE 3.5X14  [employer]  Left  1 Implanted  SCREW COMP KREULOCK 2.7X14 - [id number] Screw SCREW COMP KREULOCK 2.7X14  [employer]  Left 3 Implanted  SCREW COMP KREULOCK 2.7X16 - [id number] Screw SCREW COMP KREULOCK 2.7X16  [employer]  Left 1 Implanted  SCREW COMP KREULOCK 2.7X12 - [id number] Screw SCREW COMP KREULOCK 2.7X12  [employer]  Left 1 Implanted  SCREW COMP KREULOCK 3.5X12 -  [id number] Screw SCREW COMP KREULOCK 3.5X12  [employer]  Left 1 Implanted  SCREW NON-LOCKING 3.5X12MM - [id number] Screw SCREW NON-LOCKING 3.5X12MM  [employer]  Left 1 Implanted  SCREW LOW PROFILE 4.0X40 - [id number] Screw SCREW LOW PROFILE 4.0X40  [employer]  Left 1 Implanted  SCREW LOW PROFILE CANN 4.0X45 - [id number] Screw SCREW LOW PROFILE CANN 4.0X45  [employer]  Left 1 Implanted  WASHER - [id number] Orthopedic Implant WASHER  [employer]  Left 1 Implanted    Indications for Surgery:   Maria Berg is a 42 y.o. female with complex trimalleolar ankle fracture dislocation.  Benefits and risks of operative and nonoperative management were discussed prior to surgery with patient/guardian(s) and informed consent form was completed.  Specific risks including infection, need for additional surgery, nonunion, malunion, stiffness, hardware loss of fixation amongst others.   Procedure:   The patient was identified properly. Informed consent was obtained and the surgical site was marked. The patient was taken up to suite where general anesthesia was induced.  The patient was positioned supine on a regular bed.  The left ankle was prepped and draped in the usual sterile fashion.  Timeout was performed before the beginning of the case.  Tourniquet was used for the above duration.  We began with a lateral incision.  Made a little incision just posterior to the fibula with the skin sharply obtaining hemostasis we progressed.  The proximal aspect of the incision was dissected bluntly and the deep layers to avoid damage the superficial peroneal nerve.  We were able to identify the fracture site and cleared hematoma.  Fracture was extraordinarily unstable and were able to feel through the fracture site to the posterior aspect of the tibia taking care to use blunt instruments only.  We initially tried to reduce the posterior aspect of the tibia from the lateral side however it was clear that this would  not be able to be achieved.  We went to the medial side made a longitudinal incision in the posterior aspect of the medial malleolar fracture which were easily palpated through the skin.  We took care to protect the neurovascular structures and were easily able to identify the fracture site with interposed periosteum of the medial malleolus.  The primary complexity of this case was the posterior malleolus.  I felt that a posterior approach would end up devascularizing the extreme comminution and make her likelihood of healing less while making the fixation more complicated.  The posterior malleolus was fragmented and the multiple fragments and I instead used the medial fracture to sublux the joint posterior lateral and identify the posterior malleolus fracture fragments.  There was a intercalary articular fragment that was turned to 180 degrees aiming the articular surface superiorly.  There was quite difficult but we were able to manually grab this fragment rotated to the appropriate orientation and hold it rotated while driving a pin for a cannulated screw from anterior to posterior taking care to verify that the pin and the eventual screw did not invade the articular surface and confirming this on fluoroscopy.  I was able to  visualize the pin to capture the intercalary fragment before having to reduce the ankle and drive the remainder the way through the posterior mall comminuted fragments.  We drilled and placed a cannulated 4.0 screw and had good fixation and reduction of the posterior malleolar fragment.  We then turned our attention back to the lateral side.  We were able to anatomically reduce the fracture and placed a lag screw in the form of a 2.7 screw.  We then selected an Arthrex 5 hole precontoured distal fibular locking plate.  We obtained 3 bicortical screws proximal to the fracture and four locking screws unicortical distal.  We had great fixation overall.  We then turned attention back to the  medial side and placed a single 4 mm x 45 mm cannulated screw on orthogonal fluoroscopic imaging noting anatomic reduction of the medial malleolus and used a washer to obtain better fixation.  This medial malleolar fragment was not large enough for 2 points fixation unfortunately.  We had good compression at the fracture site.  Final X rotation for stress test demonstrated obvious syndesmosis widening.  We were able to place a syndesmosis tight rope through our plate however we went 1 hole proximal to our normal location as our normal position would have violated the fracture site itself.  Her fibula was quite small and we worried that placing the tight rope outside of the plate would have led to a high risk of stress fracture or iatrogenically caused fracture.  We have good fixation of the syndesmosis and final fluoroscopic imaging demonstrated anatomic reduction and good position of the hardware without abnormality.  We irrigated copiously and closed both incisions in a multilayer fashion finishing with nylon suture.  Sterile dressing and well molded well-padded splint were placed.  Patient was purposely left in slight equinus to avoid extra tension on the posterior malleolar fracture.  Patient awoken taken back in stable condition.  Noemi Chapel, PA-C, present and scrubbed throughout the case, critical for completion in a timely fashion, and for retraction, instrumentation, closure.

## 2022-07-05 NOTE — Anesthesia Procedure Notes (Signed)
Anesthesia Regional Block: Adductor canal block   Pre-Anesthetic Checklist: , timeout performed,  Correct Patient, Correct Site, Correct Laterality,  Correct Procedure, Correct Position, site marked,  Risks and benefits discussed,  Surgical consent,  Pre-op evaluation,  At surgeon's request and post-op pain management  Laterality: Lower and Left  Prep: chloraprep       Needles:  Injection technique: Single-shot  Needle Type: Echogenic Needle     Needle Length: 9cm  Needle Gauge: 20   Needle insertion depth: 3 cm   Additional Needles:   Procedures:,,,, ultrasound used (permanent image in chart),,    Narrative:  Start time: 07/05/2022 11:35 AM End time: 07/05/2022 11:42 AM Injection made incrementally with aspirations every 5 mL.  Performed by: Personally  Anesthesiologist: Lyn Hollingshead, MD

## 2022-07-05 NOTE — Anesthesia Procedure Notes (Signed)
Procedure Name: LMA Insertion Date/Time: 07/05/2022 12:09 PM  Performed by: Bufford Spikes, CRNAPre-anesthesia Checklist: Patient identified, Emergency Drugs available, Suction available and Patient being monitored Patient Re-evaluated:Patient Re-evaluated prior to induction Oxygen Delivery Method: Circle system utilized Preoxygenation: Pre-oxygenation with 100% oxygen Induction Type: IV induction Ventilation: Mask ventilation without difficulty LMA: LMA inserted LMA Size: 4.0 Number of attempts: 1 Placement Confirmation: positive ETCO2 Tube secured with: Tape Dental Injury: Teeth and Oropharynx as per pre-operative assessment

## 2022-07-05 NOTE — Anesthesia Postprocedure Evaluation (Signed)
Anesthesia Post Note  Patient: Maria Berg  Procedure(s) Performed: OPEN REDUCTION INTERNAL FIXATION (ORIF) ANKLE FRACTURE (Left: Ankle) SYNDESMOSIS REPAIR (Left: Ankle)     Patient location during evaluation: PACU Anesthesia Type: General Level of consciousness: awake and alert Pain management: pain level controlled Vital Signs Assessment: post-procedure vital signs reviewed and stable Respiratory status: spontaneous breathing, nonlabored ventilation and respiratory function stable Cardiovascular status: stable and blood pressure returned to baseline Anesthetic complications: no   No notable events documented.  Last Vitals:  Vitals:   07/05/22 1427 07/05/22 1430  BP: (!) 130/93 (!) 146/98  Pulse: 99 94  Resp: 17 14  Temp:    SpO2: 96% 97%    Last Pain:  Vitals:   07/05/22 1457  TempSrc:   PainSc: 0-No pain    LLE Motor Response: Purposeful movement;Responds to commands (wiggles toes) (07/05/22 1457) LLE Sensation: Numbness;Tingling;No pain (07/05/22 1457)          Audry Pili

## 2022-07-05 NOTE — Progress Notes (Signed)
Assisted Dr. Jillyn Hidden with left, adductor canal, popliteal, ultrasound guided block. Side rails up, monitors on throughout procedure. See vital signs in flow sheet. Tolerated Procedure well.

## 2022-07-05 NOTE — Transfer of Care (Signed)
Immediate Anesthesia Transfer of Care Note  Patient: Maria Berg  Procedure(s) Performed: OPEN REDUCTION INTERNAL FIXATION (ORIF) ANKLE FRACTURE (Left: Ankle) SYNDESMOSIS REPAIR (Left: Ankle)  Patient Location: PACU  Anesthesia Type:GA combined with regional for post-op pain  Level of Consciousness: awake, alert , and oriented  Airway & Oxygen Therapy: Patient Spontanous Breathing and Patient connected to face mask oxygen  Post-op Assessment: Report given to RN and Post -op Vital signs reviewed and stable  Post vital signs: Reviewed and stable  Last Vitals:  Vitals Value Taken Time  BP 135/98 07/05/22 1351  Temp 36.6 C 07/05/22 1350  Pulse 111 07/05/22 1353  Resp 18 07/05/22 1353  SpO2 99 % 07/05/22 1353  Vitals shown include unvalidated device data.  Last Pain:  Vitals:   07/05/22 1033  TempSrc: Oral  PainSc: 4       Patients Stated Pain Goal: 8 ([id number])  Complications: No notable events documented.

## 2022-07-05 NOTE — H&P (Signed)
PREOPERATIVE H&P  Chief Complaint: LEFT ANKLE FRACTURE  HPI: Maria Berg is a 42 y.o. female who is scheduled for, Procedure(s): OPEN REDUCTION INTERNAL FIXATION (ORIF) ANKLE FRACTURE SYNDESMOSIS REPAIR.   Patient has a past medical history significant for basal cell carcinoma.   Maria Berg is a 42 y.o. female who presented to the Emergency Department after a left ankle injury on 06/30/2022. She states that she was walking and slippers in the wet grass and slipped and felt a crack.  She complains of severe pain to her left ankle.   Symptoms are rated as moderate to severe, and have been worsening.  This is significantly impairing activities of daily living.    Please see clinic note for further details on this patient's care.    She has elected for surgical management.   Past Medical History:  Diagnosis Date   Anemia    hx of    Anxiety    Cancer (HCC)    hx of bsal cell skin cancer   Complication of anesthesia    shaky and emotional after anesthesia- hx of    Headache(784.0)    Hemorrhoids    Hx of varicella    OCD (obsessive compulsive disorder)    Postpartum care following vaginal delivery (10/23/12) 10/24/2012   Pregnancy induced hypertension    Vacuum extraction, delivered, current hospitalization 10/24/2012   Vertigo    Past Surgical History:  Procedure Laterality Date   BREAST SURGERY  2005   aug   CHOLECYSTECTOMY N/A 05/16/2015   Procedure: LAPAROSCOPIC CHOLECYSTECTOMY WITH INTRAOPERATIVE CHOLANGIOGRAM;  Surgeon: Excell Seltzer, MD;  Location: [facility];  Service: General;  Laterality: N/A;   DILATION AND CURETTAGE OF UTERUS     16 wk. w/triploidy   HEMORRHOID SURGERY N/A 05/16/2015   Procedure: HEMORRHOIDECTOMY PROLAPSED;  Surgeon: Excell Seltzer, MD;  Location: [facility];  Service: General;  Laterality: N/A;   skin cancer removal      05/11/2015 on back    WISDOM TOOTH EXTRACTION     Social History   Socioeconomic History   Marital status:  Married    Spouse name: Not on file   Number of children: 2   Years of education: Not on file   Highest education level: Bachelor's degree (e.g., BA, AB, BS)  Occupational History   Not on file  Tobacco Use   Smoking status: Never   Smokeless tobacco: Never  Vaping Use   Vaping Use: Never used  Substance and Sexual Activity   Alcohol use: Yes    Alcohol/week: 2.0 standard drinks of alcohol    Types: 2 Standard drinks or equivalent per week   Drug use: No   Sexual activity: Yes    Birth control/protection: Inserts  Other Topics Concern   Not on file  Social History Narrative   Right handed   Caffeine: 1 cup/day   Lives at home with spouse & children   Social Determinants of Health   Financial Resource Strain: Not on file  Food Insecurity: Not on file  Transportation Needs: Not on file  Physical Activity: Not on file  Stress: Not on file  Social Connections: Not on file   Family History  Problem Relation Age of Onset   Arthritis Mother    Asthma Father    Cancer Father        multiple myeloma   Depression Father    Depression Sister    Migraines Sister    Arthritis Maternal Grandmother    Diabetes  Maternal Grandfather    Alcohol abuse Paternal Grandmother    Cancer Paternal Grandmother    Mental illness Paternal Grandmother    Depression Paternal Grandmother    Alcohol abuse Paternal Grandfather    Asthma Paternal Grandfather    Diabetes Paternal Grandfather    Learning disabilities Maternal Uncle        autism   Alcohol abuse Paternal Uncle    Drug abuse Paternal Uncle    Hearing loss Neg Hx    Early death Neg Hx    Heart disease Neg Hx    Hyperlipidemia Neg Hx    Hypertension Neg Hx    Kidney disease Neg Hx    Vision loss Neg Hx    Stroke Neg Hx    Miscarriages / Stillbirths Neg Hx    Mental retardation Neg Hx    Allergies  Allergen Reactions   Sulfa Antibiotics Other (See Comments)    Never exposed Sister had live-threatening allergy to sulfa  drug   Prior to Admission medications   Medication Sig Start Date End Date Taking? Authorizing Provider  amphetamine-dextroamphetamine (ADDERALL XR) 20 MG 24 hr capsule Take 20 mg by mouth daily. 01/10/21  Yes [provider]  BIOTIN PO Take 1 tablet by mouth daily.   Yes [provider]  folic acid (FOLVITE) 1 MG tablet Take 1 mg by mouth daily. 04/12/22  Yes [provider]  NUVARING 0.12-0.015 MG/24HR vaginal ring SMARTSIG:Ring Vaginal Every 4 Weeks 12/02/20  Yes [provider]  OLANZapine (ZYPREXA) 2.5 MG tablet Take 2.5 mg by mouth daily. 12/02/20  Yes [provider]  oxyCODONE-acetaminophen (PERCOCET/ROXICET) 5-325 MG tablet Take 1 tablet by mouth every 6 (six) hours as needed for severe pain. 06/30/22  Yes Quintella Reichert, MD  SUMAtriptan (IMITREX) 50 MG tablet Take 1 tablet (50 mg total) by mouth 2 (two) times daily as needed for migraine. May repeat in 2 hours if migraine persist. 02/03/21  Yes Genia Harold, MD  ibuprofen (ADVIL,MOTRIN) 200 MG tablet Take 600 mg by mouth every 6 (six) hours as needed for headache, mild pain or moderate pain.     [provider]  ondansetron (ZOFRAN-ODT) 4 MG disintegrating tablet Take 1 tablet (4 mg total) by mouth every 8 (eight) hours as needed for nausea or vomiting. 06/30/22   Quintella Reichert, MD  promethazine (PHENERGAN) 25 MG tablet Take 1 tablet (25 mg total) by mouth every 6 (six) hours as needed for nausea. 05/16/15   Excell Seltzer, MD  QULIPTA 60 MG TABS Take 1 tablet by mouth daily. 08/10/21   Genia Harold, MD    ROS: All other systems have been reviewed and were otherwise negative with the exception of those mentioned in the HPI and as above.  Physical Exam: General: Alert, no acute distress Cardiovascular: No pedal edema Respiratory: No cyanosis, no use of accessory musculature GI: No organomegaly, abdomen is soft and non-tender Skin: No lesions in the area of chief  complaint Neurologic: Sensation intact distally Psychiatric: Patient is competent for consent with normal mood and affect Lymphatic: No axillary or cervical lymphadenopathy  MUSCULOSKELETAL:  Left lower extremity: splint CDI, +EHL though remainder of motor difficult to test due to splint, sensation intact distally with warm well perfused foot, no pain w passive stretch  Imaging: CT of the left ankle demonstrates a trimalleolar fracture of the left ankle  Assessment: LEFT ANKLE FRACTURE  Plan: Plan for Procedure(s): OPEN REDUCTION INTERNAL FIXATION (ORIF) ANKLE FRACTURE SYNDESMOSIS REPAIR  The risks  benefits and alternatives were discussed with the patient including but not limited to the risks of nonoperative treatment, versus surgical intervention including infection, bleeding, nerve injury,  blood clots, cardiopulmonary complications, morbidity, mortality, among others, and they were willing to proceed.   The patient acknowledged the explanation, agreed to proceed with the plan and consent was signed.   Operative Plan: ORIF left ankle fracture Discharge Medications: standard DVT Prophylaxis: aspirin Physical Therapy: outpatient Special Discharge needs: splint   Ethelda Chick, PA-C  07/05/2022 6:56 AM

## 2022-07-05 NOTE — Anesthesia Procedure Notes (Signed)
Anesthesia Regional Block: Popliteal block   Pre-Anesthetic Checklist: , timeout performed,  Correct Patient, Correct Site, Correct Laterality,  Correct Procedure, Correct Position, site marked,  Risks and benefits discussed,  Surgical consent,  Pre-op evaluation,  At surgeon's request and post-op pain management  Laterality: Lower and Left  Prep: chloraprep       Needles:  Injection technique: Single-shot  Needle Type: Echogenic Needle     Needle Length: 9cm  Needle Gauge: 20   Needle insertion depth: 1.5 cm   Additional Needles:   Procedures:,,,, ultrasound used (permanent image in chart),,    Narrative:  Start time: 07/05/2022 11:45 AM End time: 07/05/2022 11:50 AM Injection made incrementally with aspirations every 5 mL.  Performed by: Personally  Anesthesiologist: Lyn Hollingshead, MD

## 2022-07-05 NOTE — Discharge Instructions (Addendum)
Ophelia Charter MD, MPH [place] [address] [phone] (tel)   [phone] (fax)   POST-OPERATIVE INSTRUCTIONS - LOWER EXTREMITY   WOUND CARE Please keep splint clean dry and intact until followup.  You may shower on Post-Op Day #3.  You must keep splint dry during this process and may find that a plastic bag taped around the leg or alternatively a towel based bath may be a better option.   If you get your splint wet or if it is damaged please contact our clinic.  EXERCISES Due to your splint being in place you will not be able to bear weight through your extremity.   DO NOT PUT ANY WEIGHT ON YOUR OPERATIVE LEG Please use crutches or a walker to avoid weight bearing.   REGIONAL ANESTHESIA (NERVE BLOCKS) The anesthesia team may have performed a nerve block for you this is a great tool used to minimize pain.   The block may start wearing off overnight (between 8-24 hours postop) When the block wears off, your pain may go from nearly zero to the pain you would have had postop without the block. This is an abrupt transition but nothing dangerous is happening.   This can be a challenging period but utilize your as needed pain medications to try and manage this period. We suggest you use the pain medication the first night prior to going to bed, to ease this transition.  You may take an extra dose of narcotic when this happens if needed   POST-OP MEDICATIONS- Multimodal approach to pain control In general your pain will be controlled with a combination of substances.  Prescriptions unless otherwise discussed are electronically sent to your pharmacy.  This is a carefully made plan we use to minimize narcotic use.     Meloxicam - Anti-inflammatory medication taken on a scheduled basis Acetaminophen - Non-narcotic pain medicine taken on a scheduled basis  Gabapentin - this is to help with nerve based pain, take on a scheduled basis Oxycodone - This  is a strong narcotic, to be used only on an "as needed" basis for SEVERE pain. Aspirin '81mg'$  - This medicine is used to minimize the risk of blood clots after surgery. Zofran - take as needed for nausea   FOLLOW-UP If you develop a Fever (>101.5), Redness or Drainage from the surgical incision site, please call our office to arrange for an evaluation. Please call the office to schedule a follow-up appointment for your incision check if you do not already have one, 7-10 days post-operatively.  IF YOU HAVE ANY QUESTIONS, PLEASE FEEL FREE TO CALL OUR OFFICE.  HELPFUL INFORMATION  If you had a block, it will wear off between 8-24 hrs postop typically.  This is period when your pain may go from nearly zero to the pain you would have had postop without the block.  This is an abrupt transition but nothing dangerous is happening.  You may take an extra dose of narcotic when this happens.  You should wean off your narcotic medicines as soon as you are able.  Most patients will be off or using minimal narcotics before their first postop appointment.   We suggest you use the pain medication the first night prior to going to bed, in order to ease any pain when the anesthesia wears off. You should avoid taking pain medications on an empty stomach as it will make you nauseous.  Do not drink alcoholic beverages or take illicit drugs when taking  pain medications.  In most states it is against the law to drive while you are in a splint or sling.  And certainly against the law to drive while taking narcotics.  You may return to work/school in the next couple of days when you feel up to it.   Pain medication may make you constipated.  Below are a few solutions to try in this order: Decrease the amount of pain medication if you aren't having pain. Drink lots of decaffeinated fluids. Drink prune juice and/or each dried prunes  If the first 3 don't work start with additional solutions Take Colace - an  over-the-counter stool softener Take Senokot - an over-the-counter laxative Take Miralax - a stronger over-the-counter laxative  For more information including helpful videos and documents visit our website:   [email or web address].[email or web address]-information.[email or web address]   May take Tylenol after 4:30pm, if needed.  May take Gabapentin after 4:30pm, if needed.  May take NSAIDS (ibuprofen, motrin) after 7:30pm, if needed.    Post Anesthesia Home Care Instructions  Activity: Get plenty of rest for the remainder of the day. A responsible individual must stay with you for 24 hours following the procedure.  For the next 24 hours, DO NOT: -Drive a car -Paediatric nurse -Drink alcoholic beverages -Take any medication unless instructed by your physician -Make any legal decisions or sign important papers.  Meals: Start with liquid foods such as gelatin or soup. Progress to regular foods as tolerated. Avoid greasy, spicy, heavy foods. If nausea and/or vomiting occur, drink only clear liquids until the nausea and/or vomiting subsides. Call your physician if vomiting continues.  Special Instructions/Symptoms: Your throat may feel dry or sore from the anesthesia or the breathing tube placed in your throat during surgery. If this causes discomfort, gargle with warm salt water. The discomfort should disappear within 24 hours.  If you had a scopolamine patch placed behind your ear for the management of post- operative nausea and/or vomiting:  1. The medication in the patch is effective for 72 hours, after which it should be removed.  Wrap patch in a tissue and discard in the trash. Wash hands thoroughly with soap and water. 2. You may remove the patch earlier than 72 hours if you experience unpleasant side effects which may include dry mouth, dizziness or visual disturbances. 3. Avoid touching the patch. Wash your hands with soap and water after contact with the patch.   Regional Anesthesia Blocks  1.  Numbness or the inability to move the "blocked" extremity may last from 3-48 hours after placement. The length of time depends on the medication injected and your individual response to the medication. If the numbness is not going away after 48 hours, call your surgeon.  2. The extremity that is blocked will need to be protected until the numbness is gone and the  Strength has returned. Because you cannot feel it, you will need to take extra care to avoid injury. Because it may be weak, you may have difficulty moving it or using it. You may not know what position it is in without looking at it while the block is in effect.  3. For blocks in the legs and feet, returning to weight bearing and walking needs to be done carefully. You will need to wait until the numbness is entirely gone and the strength has returned. You should be able to move your leg and foot normally before you try and bear weight or walk. You will need someone to be with you when you  first try to ensure you do not fall and possibly risk injury.  4. Bruising and tenderness at the needle site are common side effects and will resolve in a few days.  5. Persistent numbness or new problems with movement should be communicated to the surgeon or the [facility] [phone] [place] [phone]).Information for Discharge Teaching: EXPAREL (bupivacaine liposome injectable suspension)   Your surgeon or anesthesiologist gave you EXPAREL(bupivacaine) to help control your pain after surgery.  EXPAREL is a local anesthetic that provides pain relief by numbing the tissue around the surgical site. EXPAREL is designed to release pain medication over time and can control pain for up to 72 hours. Depending on how you respond to EXPAREL, you may require less pain medication during your recovery.  Possible side effects: Temporary loss of sensation or ability to move in the area where bupivacaine was injected. Nausea,  vomiting, constipation Rarely, numbness and tingling in your mouth or lips, lightheadedness, or anxiety may occur. Call your doctor right away if you think you may be experiencing any of these sensations, or if you have other questions regarding possible side effects.  Follow all other discharge instructions given to you by your surgeon or nurse. Eat a healthy diet and drink plenty of water or other fluids.  If you return to the hospital for any reason within 96 hours following the administration of EXPAREL, it is important for health care providers to know that you have received this anesthetic. A teal colored band has been placed on your arm with the date, time and amount of EXPAREL you have received in order to alert and inform your health care providers. Please leave this armband in place for the full 96 hours following administration, and then you may remove the band.

## 2022-07-09 ENCOUNTER — Encounter (HOSPITAL_BASED_OUTPATIENT_CLINIC_OR_DEPARTMENT_OTHER): Payer: Self-pay | Admitting: Orthopaedic Surgery

## 2023-11-22 ENCOUNTER — Encounter: Payer: Self-pay | Admitting: Advanced Practice Midwife
